# Patient Record
Sex: Male | Born: 1960 | Race: White | Hispanic: No | Marital: Married | Smoking: Former smoker
Health system: Southern US, Community
[De-identification: ages and names within clinical notes are randomized; demographics above are authoritative.]

## PROBLEM LIST (undated history)

## (undated) DIAGNOSIS — E785 Hyperlipidemia, unspecified: Secondary | ICD-10-CM

## (undated) DIAGNOSIS — M109 Gout, unspecified: Secondary | ICD-10-CM

## (undated) DIAGNOSIS — G4730 Sleep apnea, unspecified: Secondary | ICD-10-CM

## (undated) DIAGNOSIS — I890 Lymphedema, not elsewhere classified: Secondary | ICD-10-CM

## (undated) DIAGNOSIS — K819 Cholecystitis, unspecified: Secondary | ICD-10-CM

## (undated) DIAGNOSIS — M48062 Spinal stenosis, lumbar region with neurogenic claudication: Secondary | ICD-10-CM

## (undated) DIAGNOSIS — M65352 Trigger finger, left little finger: Secondary | ICD-10-CM

## (undated) DIAGNOSIS — I872 Venous insufficiency (chronic) (peripheral): Secondary | ICD-10-CM

## (undated) DIAGNOSIS — M1990 Unspecified osteoarthritis, unspecified site: Secondary | ICD-10-CM

## (undated) DIAGNOSIS — I6523 Occlusion and stenosis of bilateral carotid arteries: Secondary | ICD-10-CM

## (undated) DIAGNOSIS — M0600 Rheumatoid arthritis without rheumatoid factor, unspecified site: Secondary | ICD-10-CM

## (undated) DIAGNOSIS — H903 Sensorineural hearing loss, bilateral: Secondary | ICD-10-CM

## (undated) DIAGNOSIS — M5416 Radiculopathy, lumbar region: Secondary | ICD-10-CM

## (undated) DIAGNOSIS — E119 Type 2 diabetes mellitus without complications: Secondary | ICD-10-CM

## (undated) DIAGNOSIS — M51369 Other intervertebral disc degeneration, lumbar region without mention of lumbar back pain or lower extremity pain: Secondary | ICD-10-CM

## (undated) DIAGNOSIS — I10 Essential (primary) hypertension: Secondary | ICD-10-CM

## (undated) DIAGNOSIS — G5602 Carpal tunnel syndrome, left upper limb: Secondary | ICD-10-CM

## (undated) DIAGNOSIS — M5412 Radiculopathy, cervical region: Secondary | ICD-10-CM

## (undated) DIAGNOSIS — N401 Enlarged prostate with lower urinary tract symptoms: Secondary | ICD-10-CM

## (undated) DIAGNOSIS — I700 Atherosclerosis of aorta: Secondary | ICD-10-CM

## (undated) DIAGNOSIS — I2510 Atherosclerotic heart disease of native coronary artery without angina pectoris: Secondary | ICD-10-CM

## (undated) DIAGNOSIS — M5030 Other cervical disc degeneration, unspecified cervical region: Secondary | ICD-10-CM

## (undated) DIAGNOSIS — K760 Fatty (change of) liver, not elsewhere classified: Secondary | ICD-10-CM

## (undated) DIAGNOSIS — K219 Gastro-esophageal reflux disease without esophagitis: Secondary | ICD-10-CM

## (undated) DIAGNOSIS — G5601 Carpal tunnel syndrome, right upper limb: Secondary | ICD-10-CM

## (undated) DIAGNOSIS — D649 Anemia, unspecified: Secondary | ICD-10-CM

## (undated) HISTORY — DX: Type 2 diabetes mellitus without complications: E11.9

## (undated) HISTORY — DX: Unspecified osteoarthritis, unspecified site: M19.90

## (undated) HISTORY — PX: HERNIA REPAIR: SHX51

## (undated) HISTORY — DX: Essential (primary) hypertension: I10

## (undated) HISTORY — PX: NEUROPLASTY / TRANSPOSITION MEDIAN NERVE AT CARPAL TUNNEL: SUR893

## (undated) HISTORY — PX: CHOLECYSTECTOMY: SHX55

## (undated) HISTORY — PX: COLONOSCOPY: SHX174

## (undated) HISTORY — PX: NECK SURGERY: SHX720

---

## 1998-01-16 HISTORY — PX: SPINE SURGERY: SHX786

## 2003-01-17 HISTORY — PX: UMBILICAL HERNIA REPAIR: SHX196

## 2004-11-06 ENCOUNTER — Ambulatory Visit: Payer: Self-pay | Admitting: Internal Medicine

## 2004-11-18 ENCOUNTER — Ambulatory Visit: Payer: Self-pay | Admitting: Internal Medicine

## 2004-12-19 ENCOUNTER — Ambulatory Visit: Payer: Self-pay | Admitting: Internal Medicine

## 2007-11-03 ENCOUNTER — Ambulatory Visit: Payer: Self-pay | Admitting: Podiatry

## 2007-12-25 ENCOUNTER — Other Ambulatory Visit: Payer: Self-pay

## 2007-12-25 ENCOUNTER — Ambulatory Visit: Payer: Self-pay | Admitting: Podiatry

## 2008-01-01 ENCOUNTER — Ambulatory Visit: Payer: Self-pay | Admitting: Podiatry

## 2009-08-15 ENCOUNTER — Ambulatory Visit: Payer: Self-pay | Admitting: Gastroenterology

## 2011-02-20 ENCOUNTER — Ambulatory Visit: Payer: Self-pay | Admitting: Cardiology

## 2011-05-20 ENCOUNTER — Ambulatory Visit: Payer: Self-pay | Admitting: Unknown Physician Specialty

## 2013-01-18 ENCOUNTER — Ambulatory Visit: Payer: Self-pay | Admitting: Unknown Physician Specialty

## 2013-03-11 ENCOUNTER — Ambulatory Visit: Payer: Self-pay | Admitting: Physical Medicine and Rehabilitation

## 2014-05-30 DIAGNOSIS — G4733 Obstructive sleep apnea (adult) (pediatric): Secondary | ICD-10-CM | POA: Insufficient documentation

## 2014-06-16 ENCOUNTER — Ambulatory Visit: Payer: Self-pay | Admitting: Physical Medicine and Rehabilitation

## 2014-08-18 HISTORY — PX: SPINE SURGERY: SHX786

## 2014-12-15 ENCOUNTER — Encounter: Payer: Self-pay | Admitting: Rheumatology

## 2014-12-19 ENCOUNTER — Encounter: Payer: Self-pay | Admitting: Rheumatology

## 2015-01-17 ENCOUNTER — Encounter
Admit: 2015-01-17 | Disposition: A | Discharge status: Home / Self Care | Payer: Self-pay | Attending: Rheumatology | Admitting: Rheumatology

## 2015-02-18 ENCOUNTER — Emergency Department: Admit: 2015-02-18 | Disposition: A | Discharge status: Home / Self Care | Payer: Self-pay | Admitting: Internal Medicine

## 2015-02-18 LAB — CBC
HCT: 44.9 % (ref 40.0–52.0)
HGB: 15.4 g/dL (ref 13.0–18.0)
MCH: 29.3 pg (ref 26.0–34.0)
MCHC: 34.2 g/dL (ref 32.0–36.0)
MCV: 86 fL (ref 80–100)
PLATELETS: 231 10*3/uL (ref 150–440)
RBC: 5.25 10*6/uL (ref 4.40–5.90)
RDW: 14.5 % (ref 11.5–14.5)
WBC: 10.5 10*3/uL (ref 3.8–10.6)

## 2015-02-18 LAB — COMPREHENSIVE METABOLIC PANEL
ALK PHOS: 82 U/L
ALT: 26 U/L
Albumin: 4.5 g/dL
Anion Gap: 8 (ref 7–16)
BILIRUBIN TOTAL: 1.4 mg/dL — AB
BUN: 7 mg/dL
CREATININE: 0.82 mg/dL
Calcium, Total: 9.2 mg/dL
Chloride: 104 mmol/L
Co2: 25 mmol/L
EGFR (African American): >60
Glucose: 155 mg/dL — ABNORMAL HIGH
Potassium: 2.8 mmol/L — ABNORMAL LOW
SGOT(AST): 26 U/L
Sodium: 137 mmol/L
Total Protein: 7.8 g/dL

## 2015-02-18 LAB — PROTIME-INR
INR: 1
PROTHROMBIN TIME: 13.3 s

## 2015-02-18 LAB — URINALYSIS, COMPLETE
Bacteria: NONE SEEN
Bilirubin,UR: NEGATIVE
Blood: NEGATIVE
Glucose,UR: NEGATIVE mg/dL (ref 0–75)
KETONE: NEGATIVE
Leukocyte Esterase: NEGATIVE
NITRITE: NEGATIVE
PH: 7 (ref 4.5–8.0)
Protein: NEGATIVE
RBC, UR: NONE SEEN /HPF (ref 0–5)
Specific Gravity: 1.008 (ref 1.003–1.030)
Squamous Epithelial: NONE SEEN

## 2016-07-19 HISTORY — PX: CARPAL TUNNEL RELEASE: SHX101

## 2016-07-30 DIAGNOSIS — Z794 Long term (current) use of insulin: Secondary | ICD-10-CM | POA: Insufficient documentation

## 2017-01-30 DIAGNOSIS — I10 Essential (primary) hypertension: Secondary | ICD-10-CM | POA: Insufficient documentation

## 2018-05-29 ENCOUNTER — Ambulatory Visit
Admission: RE | Admit: 2018-05-29 | Discharge: 2018-05-29 | Disposition: A | Discharge status: Home / Self Care | Payer: BLUE CROSS/BLUE SHIELD | Source: Ambulatory Visit | Attending: Family Medicine | Admitting: Family Medicine

## 2018-05-29 ENCOUNTER — Other Ambulatory Visit: Payer: Self-pay | Admitting: Family Medicine

## 2018-05-29 DIAGNOSIS — R0602 Shortness of breath: Secondary | ICD-10-CM | POA: Diagnosis not present

## 2018-05-29 DIAGNOSIS — J9811 Atelectasis: Secondary | ICD-10-CM | POA: Diagnosis not present

## 2018-05-29 DIAGNOSIS — I517 Cardiomegaly: Secondary | ICD-10-CM | POA: Diagnosis not present

## 2020-04-05 ENCOUNTER — Other Ambulatory Visit: Payer: Self-pay | Admitting: Rheumatology

## 2020-04-05 DIAGNOSIS — M542 Cervicalgia: Secondary | ICD-10-CM

## 2020-04-05 DIAGNOSIS — M4802 Spinal stenosis, cervical region: Secondary | ICD-10-CM

## 2020-04-05 DIAGNOSIS — M159 Polyosteoarthritis, unspecified: Secondary | ICD-10-CM

## 2020-04-20 ENCOUNTER — Other Ambulatory Visit: Payer: Self-pay

## 2020-04-20 ENCOUNTER — Ambulatory Visit
Admission: RE | Admit: 2020-04-20 | Discharge: 2020-04-20 | Disposition: A | Discharge status: Home / Self Care | Payer: BC Managed Care – PPO | Source: Ambulatory Visit | Attending: Rheumatology | Admitting: Rheumatology

## 2020-04-20 DIAGNOSIS — M542 Cervicalgia: Secondary | ICD-10-CM | POA: Insufficient documentation

## 2020-04-20 DIAGNOSIS — M8949 Other hypertrophic osteoarthropathy, multiple sites: Secondary | ICD-10-CM | POA: Diagnosis present

## 2020-04-20 DIAGNOSIS — M159 Polyosteoarthritis, unspecified: Secondary | ICD-10-CM

## 2020-04-20 DIAGNOSIS — M4802 Spinal stenosis, cervical region: Secondary | ICD-10-CM | POA: Insufficient documentation

## 2020-10-04 DIAGNOSIS — I2510 Atherosclerotic heart disease of native coronary artery without angina pectoris: Secondary | ICD-10-CM | POA: Insufficient documentation

## 2020-12-25 NOTE — Progress Notes (Signed)
[facility]  [address], [address] [place], [place] [address] Phone: [phone]  Fax: [phone]   Clinic Day:  12/26/2020  Referring physician: Sharyne Peach, MD  Chief Complaint: Timothy Kennedy is a 60 y.o. male with seronegative rheumatoid arthritis/inflammatory arthritis and pancytopenia who is referred in consultation by Dr. Iona Beard for assessment and management.   HPI: The patient has a history of arthritis which was empirically treated as rheumatoid arthritis.  Rheumatoid factor and CCP were negative.  He was on a regimen of methotrexate, folic acid and Plaquenil.  Medications were discontinued in the summer 2021(?) due to lack of benefit.  He has not seen Dr. Jefm Bryant since that time.  Since subsequently he noticed increased stiffness in his hands and worsening pain in his shoulders  The patient has a long history of alcohol use. He typically drank 6-12 beers a day for many years and more recently liquor.  He stopped drinking alcohol completely on 09/18/2020.  The patient was admitted to [facility] on 11/22/2020 - 11/26/2020.  He presented with worsening fatigue, dizziness, headache, visual changes, dysphagia and a 40-50 pound weight loss in 3- 6 months (partially intentional).  He stopped drinking alcohol on 09/18/2020 and was eating smaller portions.  However weight loss was felt out of proportion to dietary changes.  Blood pressure medications were adjusted.  Upper endoscopy revealed esophagitis and small abnormal mucosa in the duodenum.  Pathology revealed gastric heterotopia.  He was started on a PPI.  He was noted to be folate deficient, likely a consequence of previous methotrexate use and discontinuation of oral folate.  He was started on supplementation.  He had a hypoproliferative marrow.  He was felt to possibly need additional evaluation if his myelosuppression and cytopenias did not improve with supplementation.  Peripheral smear revealed no large  granular lymphocytes.  Hematology was consulted.  He had a low iron and low iron saturation c/w iron deficiency.  Ferritin was elevated at 371.  Folate was 4.9 (low) and B12 was 250 (borderline).  Homocysteine 19  (elevated) MMA was pending sed rate was trending up (17 to 27).  CRP was trending up (2.6 to 3.34).  LDH was 302 and haptoglobin 52 and thus not consistent with hemolysis.  Peripheral smear revealed no evidence of large leukocytes.  He was to continue thiamine, folic acid, L24 and a multivitamin.  He was felt to need nutritional replacement and then reassessment in 1 month  Rheumatology was consulted.  It was unclear if he had rheumatoid arthritis.  He had no evidence of active synovitis tested negative for rheumatoid factor and anti-CCP antibodies.  Felty syndrome was not felt to occur in patients with seronegative rheumatoid arthritis possibility of large granular lymphocyte syndrome associated with inflammatory polyarthritis was found felt to be a possible explanation.  And no evidence of large granular lymphocytes on review of the peripheral smear.  T-cell clonality studies were considered.  Chest, abdomen, and pelvis CT on 11/23/2020 revealed no etiology for patient's unintentional weight loss. There was splenomegaly (no measurement).  Colonoscopy on 09/12/2020 revealed one tubular adenoma and one tubulovillous adenoma.    The patient saw Dr. Iona Beard on 12/15/2020. He was referred to hematology.  He uses a CPAP for sleep apnea.  Labs followed: 02/18/2015:  Hematocrit 44.9, hemoglobin 15.4, MCV 86.0, platelets 231,000, WBC 10,500. 09/19/2016:  Hematocrit 38.5, hemoglobin 13.5, MCV 93.4, platelets 205,000, WBC   5,300. 09/19/2017:  Hematocrit 38.1, hemoglobin 12.8, MCV 95.7, platelets 195,000, WBC  6,400. 03/26/2018:  Hematocrit 37.7, hemoglobin 13.2, MCV 96.2, platelets 191,000, WBC   6,900. 06/24/2018:  Hematocrit 39.9, hemoglobin 13.6, MCV 96.4, platelets 205,000, WBC    5,300. 09/24/2018:  Hematocrit 38.7, hemoglobin 13.0, MCV 97.2, platelets 191,000, WBC   5,400. 03/29/2019:  Hematocrit 42.0, hemoglobin 14.6, MCV 98.1, platelets 231,000, WBC   7,200. 07/01/2019:  Hematocrit 39.6, hemoglobin 13.4, MCV 99.2, platelets 208,000, WBC   5,500. 09/23/2019:  Hematocrit 39.9, hemoglobin 13.4, MCV 97.1, platelets 188,000, WBC   5,300. 11/22/2020:  Hematocrit 30.2, hemoglobin 10.0, MCV 89.0, platelets 162,000, WBC   2,000. 11/26/2020:  Hematocrit 26.4, hemoglobin   8.6, MCV 89.0, platelets 160,000, WBC   1,900. 12/11/2020:  Hematocrit 32.9, hemoglobin 10.8, MCV 90.0, platelets 189,000, WBC   3,600.  Additional labs: 11/22/2020: Ferritin was 371 with an iron saturation of 16% and a TIBC 264. Vitamin B12 was 250 (low normal) and folate 4.9 (>6.5).  Homocysteine was 19 (< 13).  MMA was 0.26 (normal).  Coombs negative. Retic 3.81% on 11/24/2020.  HIV testing negative.  PT 13.0 and INR 1.1 on 11/24/2020.  PTT was 50.7 (elevated) with a fibrinogen of 337 and a D-dimer of 2477 (< 500).  COVID-19 testing was negative.  TSH was 1.47 with a free T4 1.03.  LFTs were normal on 11/23/2020 except for an albumin of 2.8.  Total protein was 6.3 (normal).  Sed rate was 27 (< 20) and CRP 3.34 (< 0.6) on 11/23/2020.  LDH was 302 (100-200) on 11/23/2020.  Symptomatically, he denies any fevers or sweats.  He states that he is working on weight loss (350 pounds to 310 pounds) secondary to back issues.  He notes a cough and some sinus issues.  He denies any abdominal symptoms.  He denies any melena or hematochezia.  He is on Protonix.  He notes pain in his neck, left shoulder, hands, left knee, lower back and feet.  He has some numbness in his toes.    Regarding his diet, he is no longer snacking/grazing.  He is eating 2 meals a day.  He is not eating very much green leafy vegetables.  He is taking H63 and folic acid.  He is not drinking alcohol.  He denies any new medications or herbal  products.   Past Medical History:  Diagnosis Date  . Arthritis   . Diabetes mellitus without complication ([place])   . Hypertension     Past Surgical History:  Procedure Laterality Date  . CARPAL TUNNEL RELEASE Bilateral 07/2016   surgey on both hands  . NECK SURGERY  01/1998 08/2019  . SPINE SURGERY  01/1998   fusion of C5 and C6  . SPINE SURGERY  08/2014   fusion   . UMBILICAL HERNIA REPAIR  01/2003    Family History  Problem Relation Age of Onset  . Hypertension Mother   . Diabetes Mother   . Hypertension Father     Social History:  reports that he has never smoked. He has quit using smokeless tobacco.  His smokeless tobacco use included chew. He reports previous alcohol use. He reports that he does not use drugs.  He chewed tobacco when he was young (none in the past 25 years).  He is exposed to dust and asbestos.  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  He lives in [place].  The patient is alone today.  Allergies:  Allergies  Allergen Reactions  . Atorvastatin  Other (See Comments)  . Metformin Diarrhea    Current Medications: Current Outpatient Medications  Medication Sig Dispense Refill  . aspirin 81 MG EC tablet Take by mouth daily.    . Blood Glucose Monitoring Suppl (FIFTY50 GLUCOSE METER 2.0) w/Device KIT Use as directed E11.9    . carvedilol (COREG) 25 MG tablet Take by mouth 2 (two) times daily.    . cyanocobalamin 1000 MCG tablet Take 1,000 mcg by mouth daily.    . DULoxetine (CYMBALTA) 30 MG capsule Take by mouth daily.    . fluticasone (FLONASE) 50 MCG/ACT nasal spray Place into the nose as needed.    . folic acid (FOLVITE) 1 MG tablet Take by mouth daily.    Marland Kitchen glipiZIDE (GLUCOTROL XL) 10 MG 24 hr tablet Take by mouth 2 (two) times daily.    Marland Kitchen lisinopril (ZESTRIL) 20 MG tablet Take by mouth daily.    . Multiple Vitamin (QUINTABS) TABS Take 1 tablet by mouth daily.    Glory Rosebush VERIO test strip daily.    . pantoprazole (PROTONIX) 40 MG tablet Take by mouth daily.    . pravastatin (PRAVACHOL) 20 MG tablet Take by mouth daily.    . SUMAtriptan (IMITREX) 25 MG tablet Take by mouth as needed.    . thiamine 100 MG tablet Take by mouth daily.     No current facility-administered medications for this visit.    Review of Systems  Constitutional: Negative for chills, diaphoresis, fever, malaise/fatigue and weight loss (intentional).  HENT: Negative for congestion, ear discharge, ear pain, hearing loss, nosebleeds, sinus pain, sore throat and tinnitus.   Eyes: Negative for blurred vision.       Floaters.  Respiratory: Negative for cough (secondary to sinus issues), hemoptysis, sputum production and shortness of breath.        Sleep apnea.  Cardiovascular: Negative for chest pain, palpitations and leg swelling.  Gastrointestinal: Negative for abdominal pain, blood in stool, constipation, diarrhea, heartburn, melena, nausea and vomiting.  Genitourinary: Negative for dysuria, frequency, hematuria and urgency.  Musculoskeletal: Positive for joint pain (neck, hands, left knee, left shoulder, lower back, feet). Negative for back pain, myalgias and neck pain.       Spinal stenosis.  Skin: Negative for itching and rash.  Neurological: Negative for dizziness, tingling, sensory change (numbness in toes), weakness and headaches (Intermittent migraines).  Endo/Heme/Allergies: Does not bruise/bleed easily.  Psychiatric/Behavioral: Negative for depression and memory loss. The patient is not nervous/anxious and does not have insomnia.   All other systems reviewed and are negative.  Performance status (ECOG): 1  Vitals Blood pressure (!) 156/88, pulse 70, temperature (!) 97.1 F (36.2 C), temperature source Tympanic, resp. rate 16, weight (!) 309 lb 10.2 oz (140.5 kg), SpO2 97 %.   Physical Exam Vitals and nursing note reviewed.  Constitutional:      General: He is not  in acute distress.    Appearance: He is not diaphoretic.     Comments: Heavyset gentleman sitting comfortably in the exam room in no acute distress.  HENT:     Head: Normocephalic and atraumatic.     Comments: Male pattern baldness.  Gray beard and ponytail.    Mouth/Throat:     Mouth: Mucous membranes are moist.     Pharynx: Oropharynx is clear.  Eyes:     General: No scleral icterus.    Extraocular Movements: Extraocular movements intact.     Conjunctiva/sclera: Conjunctivae normal.     Pupils: Pupils are equal,  round, and reactive to light.     Comments: Brown eyes.  Cardiovascular:     Rate and Rhythm: Normal rate and regular rhythm.     Heart sounds: Normal heart sounds. No murmur heard.   Pulmonary:     Effort: Pulmonary effort is normal. No respiratory distress.     Breath sounds: Normal breath sounds. No wheezing or rales.  Chest:     Chest wall: No tenderness.  Breasts:     Right: No axillary adenopathy or supraclavicular adenopathy.     Left: No axillary adenopathy or supraclavicular adenopathy.    Abdominal:     General: Bowel sounds are normal. There is no distension.     Palpations: Abdomen is soft. There is no mass.     Tenderness: There is no abdominal tenderness. There is no guarding or rebound.     Comments: Questionable spleen tip.  Musculoskeletal:        General: No swelling or tenderness. Normal range of motion.     Cervical back: Normal range of motion and neck supple.  Lymphadenopathy:     Head:     Right side of head: No preauricular, posterior auricular or occipital adenopathy.     Left side of head: No preauricular, posterior auricular or occipital adenopathy.     Cervical: No cervical adenopathy.     Upper Body:     Right upper body: No supraclavicular or axillary adenopathy.     Left upper body: No supraclavicular or axillary adenopathy.     Lower Body: No right inguinal adenopathy. No left inguinal adenopathy.  Skin:    General: Skin is  warm and dry.  Neurological:     Mental Status: He is alert and oriented to person, place, and time.  Psychiatric:        Behavior: Behavior normal.        Thought Content: Thought content normal.        Judgment: Judgment normal.    No visits with results within 3 Day(s) from this visit.  Latest known visit with results is:  No results found for any previous visit.    Assessment:  Timothy Kennedy is a 60 y.o. male with seronegative rheumatoid arthritis/inflammatory arthritis and pancytopenia.   Work-up during [facility] admission from 11/22/2020 - 11/26/2020 revealed a ferritin 371 with an iron saturation of 16% and a TIBC 264. Sed rate was 27 (< 20) and CRP 3.34 (< 0.6)   Retic was 3.81%.  Vitamin B12 was 250 (low normal) and folate 4.9 (>6.5).  Albumen was 2.8 (low) and total protein 6.3 (normal).  LDH was 302 (100-200).  Normal studies included:  Coombs, HIV testing, COVID-19 testing, TSH/free T4, PT/INR, fibrinogen.  PTT was 50.7 (elevated) and D-dimer of 2477 (< 500).   CBC on 11/26/2020 revealed a hematocrit 26.4, hemoglobin   8.6, MCV 89.0, platelets 160,000, WBC 1,900.  CBC on 12/11/2020 revealed a hematocrit 32.9, hemoglobin 10.8, MCV 90.0, platelets 189,000, WBC 3,600.  He has B12 deficiency and folate deficiency.  B12 was 250 and folate 4.9 on 11/22/2020.  He is on B12 2000 mcg a day and folate 1 mg a day  Chest, abdomen, and pelvis CT on 11/23/2020 at [facility] revealed no etiology for patient's unintentional weight loss. There was splenomegaly (no measurement).  Colonoscopy on 09/12/2020 revealed one tubular adenoma and one tubulovillous adenoma.  Upper endoscopy on 11/24/2020 revealed esophagitis and some abnormal mucosa in the duodenum. Pathology showed gastric heterotopia.   The patient  has a long history of alcohol use. He typically drank 6-12 beers a day for many years and more recently liquor.  He stopped drinking alcohol on 09/18/2020.  Symptomatically, he denies any fevers or  sweats.  He has had intentional weight loss.  He has joint pain.  Exam reveals questionable spleen tip.  Plan: 1.   Labs today:  CBC with diff, CMP, myeloma panel, LDH, ferritin, iron studies, B12, folate, sed rate, CRP, SPEP, PTT   2.   Peripheral smear for path review. 3.   Pancytopenia  Etiology is unclear.  Discuss differential diagnosis of pancytopenia and imaging at [facility] notable for splenomegaly.   Contact [facility] regarding measurements of spleen.  Differential diagnosis of splenomegaly:  liver disease/cirrhosis, malignancy (leukemia, lymphoma, MPD), infection (hepatitis, HIV, EBV, CMV), and autoimmune (SLE, RA).   Pancytopenia may have been secondary to B12 and folate deficiency.   He is on B12 and folate supplementation and is no longer drinking alcohol.  Discuss plans for likely bone marrow aspirate and biopsy if work-up negative and pancytopenia persist. 4.   Elevated PTT  PTT was 50.7 (elevated) during his [facility] admission.  Recheck PTT.    If PTT elevated will perform PTT with mix to determine factor deficiency or inhibitor. 5.   RN: Please call radiology at [facility] and obtain measurements of spleen. 6.   RTC in 1 week for MD assess, review of work-up and discussion regarding direction of therapy.  Addendum: Spleen size on the [facility] abdomen CT on 11/23/2020 was 18.9 x 7.5 x 14.6 cm.  I discussed the assessment and treatment plan with the patient.  The patient was provided an opportunity to ask questions and all were answered.  The patient agreed with the plan and demonstrated an understanding of the instructions.  The patient was advised to call back if the symptoms worsen or if the condition fails to improve as anticipated.   Xander Jutras C. Mike Gip, MD, PhD    12/26/2020, 2:38 PM

## 2020-12-26 ENCOUNTER — Inpatient Hospital Stay: Payer: BC Managed Care – PPO | Attending: Hematology and Oncology | Admitting: Hematology and Oncology

## 2020-12-26 ENCOUNTER — Other Ambulatory Visit: Payer: Self-pay

## 2020-12-26 ENCOUNTER — Encounter: Payer: Self-pay | Admitting: Hematology and Oncology

## 2020-12-26 ENCOUNTER — Inpatient Hospital Stay: Payer: BC Managed Care – PPO

## 2020-12-26 VITALS — BP 156/88 | HR 70 | Temp 97.1°F | Resp 16 | Wt 309.6 lb

## 2020-12-26 DIAGNOSIS — K760 Fatty (change of) liver, not elsewhere classified: Secondary | ICD-10-CM

## 2020-12-26 DIAGNOSIS — E538 Deficiency of other specified B group vitamins: Secondary | ICD-10-CM

## 2020-12-26 DIAGNOSIS — R791 Abnormal coagulation profile: Secondary | ICD-10-CM | POA: Diagnosis present

## 2020-12-26 DIAGNOSIS — R161 Splenomegaly, not elsewhere classified: Secondary | ICD-10-CM

## 2020-12-26 DIAGNOSIS — D61818 Other pancytopenia: Secondary | ICD-10-CM | POA: Insufficient documentation

## 2020-12-26 LAB — COMPREHENSIVE METABOLIC PANEL
ALT: 16 U/L (ref 0–44)
AST: 24 U/L (ref 15–41)
Albumin: 3.8 g/dL (ref 3.5–5.0)
Alkaline Phosphatase: 60 U/L (ref 38–126)
Anion gap: 12 (ref 5–15)
BUN: 12 mg/dL (ref 8–23)
CO2: 26 mmol/L (ref 22–32)
Calcium: 9.2 mg/dL (ref 8.9–10.3)
Chloride: 101 mmol/L (ref 98–111)
Creatinine, Ser: 0.94 mg/dL (ref 0.61–1.24)
GFR, Estimated: >60 mL/min (ref >60)
Glucose, Bld: 157 mg/dL — ABNORMAL HIGH (ref 70–99)
Potassium: 3.8 mmol/L (ref 3.5–5.1)
Sodium: 139 mmol/L (ref 135–145)
Total Bilirubin: 1 mg/dL (ref 0.3–1.2)
Total Protein: 8 g/dL (ref 6.5–8.1)

## 2020-12-26 LAB — CBC WITH DIFFERENTIAL/PLATELET
Abs Immature Granulocytes: 0.02 10*3/uL (ref 0.00–0.07)
Basophils Absolute: 0 10*3/uL (ref 0.0–0.1)
Basophils Relative: 1 %
Eosinophils Absolute: 0.2 10*3/uL (ref 0.0–0.5)
Eosinophils Relative: 3 %
HCT: 39.1 % (ref 39.0–52.0)
Hemoglobin: 13.3 g/dL (ref 13.0–17.0)
Immature Granulocytes: 0 %
Lymphocytes Relative: 10 %
Lymphs Abs: 0.6 10*3/uL — ABNORMAL LOW (ref 0.7–4.0)
MCH: 29.4 pg (ref 26.0–34.0)
MCHC: 34 g/dL (ref 30.0–36.0)
MCV: 86.3 fL (ref 80.0–100.0)
Monocytes Absolute: 0.4 10*3/uL (ref 0.1–1.0)
Monocytes Relative: 7 %
Neutro Abs: 4.3 10*3/uL (ref 1.7–7.7)
Neutrophils Relative %: 79 %
Platelets: 232 10*3/uL (ref 150–400)
RBC: 4.53 MIL/uL (ref 4.22–5.81)
RDW: 13.7 % (ref 11.5–15.5)
WBC: 5.5 10*3/uL (ref 4.0–10.5)
nRBC: 0 % (ref 0.0–0.2)

## 2020-12-26 LAB — LACTATE DEHYDROGENASE: LDH: 132 U/L (ref 98–192)

## 2020-12-26 LAB — SEDIMENTATION RATE: Sed Rate: 57 mm/hr — ABNORMAL HIGH (ref 0–20)

## 2020-12-26 LAB — APTT: aPTT: 51 seconds — ABNORMAL HIGH (ref 24–36)

## 2020-12-27 LAB — PATHOLOGIST SMEAR REVIEW

## 2020-12-27 LAB — IRON AND TIBC
Iron: 60 ug/dL (ref 45–182)
Saturation Ratios: 19 % (ref 17.9–39.5)
TIBC: 309 ug/dL (ref 250–450)
UIBC: 249 ug/dL

## 2020-12-27 LAB — C-REACTIVE PROTEIN: CRP: 0.7 mg/dL (ref <1.0)

## 2020-12-27 LAB — VITAMIN B12: Vitamin B-12: 572 pg/mL (ref 180–914)

## 2020-12-27 LAB — FOLATE: Folate: 31 ng/mL (ref >5.9)

## 2020-12-27 LAB — FERRITIN: Ferritin: 91 ng/mL (ref 24–336)

## 2020-12-28 LAB — MULTIPLE MYELOMA PANEL, SERUM
Albumin SerPl Elph-Mcnc: 3.6 g/dL (ref 2.9–4.4)
Albumin/Glob SerPl: 1 (ref 0.7–1.7)
Alpha 1: 0.3 g/dL (ref 0.0–0.4)
Alpha2 Glob SerPl Elph-Mcnc: 0.8 g/dL (ref 0.4–1.0)
B-Globulin SerPl Elph-Mcnc: 1.2 g/dL (ref 0.7–1.3)
Gamma Glob SerPl Elph-Mcnc: 1.5 g/dL (ref 0.4–1.8)
Globulin, Total: 3.7 g/dL (ref 2.2–3.9)
IgA: 429 mg/dL (ref 61–437)
IgG (Immunoglobin G), Serum: 1494 mg/dL (ref 603–1613)
IgM (Immunoglobulin M), Srm: 225 mg/dL — ABNORMAL HIGH (ref 20–172)
Total Protein ELP: 7.3 g/dL (ref 6.0–8.5)

## 2021-01-01 NOTE — Progress Notes (Signed)
[facility]  [address] [place], [place] [address] Phone: [phone]  Fax: [phone]   Clinic Day:  01/02/2021  Referring physician: Sharyne Peach, MD  Chief Complaint: Timothy Kennedy is a 60 y.o. male with seronegative rheumatoid arthritis/inflammatory arthritis and pancytopenia who is seen for review of work-up and discussion regarding direction of therapy.  HPI: The patient was last seen in the hematology clinic on 12/26/2020 for new patient assessment. At that time, he was noted to have pancytopenia.  He was B12 and folate deficient on supplementation.  He is stopped drinking alcohol on 09/18/2020.  Symptomatically, he denies any fevers or sweats.  He had intentionally lost weight.  He had joint pain.  CT scans from [place] on 11/23/2020 had revealed splenomegaly (18.9 x 7.5 x 14.6 cm).  Work-up revealed a hematocrit of 39.1, hemoglobin 13.3, platelets 232,000, and WBC 5,500 with an ANC of 4300. CMP was normal. Ferritin was 91 with an iron saturation of 19% and a TIBC of 309.  Sed rate was 57 and CRP 0.7.   B12 was 572 and folate 31.0. LDH was 132. PTT was 51. SPEP revealed a polyclonal increase in 1 or more immunoglobulins.  IgM was 225 (20-172).    Peripheral smear revealed normal WBC count and differential. Erythrocytes were normocytic, with unremarkable RBC parameters. Platelet count and morphology were normal.   Symptomatically, he has been "alright." He is tired because he did not sleep well last night. He has neck, back, and shoulder pain. He has occasional diarrhea. He has had one "uncontrollable" episode of diarrhea in the past 2 weeks. He denies any excess bruising or bleeding.  He has not drank any alcohol recently.  His wife states that she has noticed a difference in the patient. He has low energy and "nothing excites him."    Past Medical History:  Diagnosis Date  . Arthritis   . Diabetes mellitus without complication ([place])   .  Hypertension     Past Surgical History:  Procedure Laterality Date  . CARPAL TUNNEL RELEASE Bilateral 07/2016   surgey on both hands  . NECK SURGERY  01/1998 08/2019  . SPINE SURGERY  01/1998   fusion of C5 and C6  . SPINE SURGERY  08/2014   fusion   . UMBILICAL HERNIA REPAIR  01/2003    Family History  Problem Relation Age of Onset  . Hypertension Mother   . Diabetes Mother   . Hypertension Father     Social History:  reports that he has never smoked. He has quit using smokeless tobacco.  His smokeless tobacco use included chew. He reports previous alcohol use. He reports that he does not use drugs.The patient is accompanied by his wife, Timothy Kennedy, today.  Allergies:  Allergies  Allergen Reactions  . Atorvastatin Other (See Comments)  . Metformin Diarrhea    Current Medications: Current Outpatient Medications  Medication Sig Dispense Refill  . aspirin 81 MG EC tablet Take by mouth daily.    . Blood Glucose Monitoring Suppl (FIFTY50 GLUCOSE METER 2.0) w/Device KIT Use as directed E11.9    . carvedilol (COREG) 25 MG tablet Take by mouth 2 (two) times daily.    . cyanocobalamin 1000 MCG tablet Take 1,000 mcg by mouth daily.    . DULoxetine (CYMBALTA) 30 MG capsule Take by mouth daily.    . fluticasone (FLONASE) 50 MCG/ACT nasal spray Place into the nose as needed.    . folic acid (FOLVITE) 1  MG tablet Take by mouth daily.    Marland Kitchen glipiZIDE (GLUCOTROL XL) 10 MG 24 hr tablet Take by mouth 2 (two) times daily.    Marland Kitchen lisinopril (ZESTRIL) 20 MG tablet Take by mouth in the morning and at bedtime.    . Multiple Vitamin (QUINTABS) TABS Take 1 tablet by mouth daily.    Glory Rosebush VERIO test strip daily.    . pantoprazole (PROTONIX) 40 MG tablet Take by mouth daily.    . pravastatin (PRAVACHOL) 20 MG tablet Take by mouth daily.    . SUMAtriptan (IMITREX) 25 MG tablet Take by mouth as needed.    . thiamine 100 MG tablet Take by mouth daily.     No current facility-administered medications  for this visit.    Review of Systems  Constitutional: Negative for chills, diaphoresis, fever, malaise/fatigue and weight loss (up 2 lbs).       Feels "alright." Tired today.  HENT: Negative for congestion, ear discharge, ear pain, hearing loss, nosebleeds, sinus pain, sore throat and tinnitus.   Eyes: Negative for blurred vision.  Respiratory: Negative for cough, hemoptysis, sputum production and shortness of breath.   Cardiovascular: Negative for chest pain, palpitations and leg swelling.  Gastrointestinal: Positive for diarrhea (on and off). Negative for abdominal pain, blood in stool, constipation, heartburn, melena, nausea and vomiting.  Genitourinary: Negative for dysuria, frequency, hematuria and urgency.  Musculoskeletal: Positive for back pain, joint pain (shoulder) and neck pain. Negative for myalgias.  Skin: Negative for itching and rash.  Neurological: Negative for dizziness, tingling, sensory change, weakness and headaches.  Endo/Heme/Allergies: Does not bruise/bleed easily.  Psychiatric/Behavioral: Negative for depression and memory loss. The patient is not nervous/anxious and does not have insomnia.   All other systems reviewed and are negative.  Performance status (ECOG): 1  Vitals Blood pressure (!) 172/77, pulse 75, temperature 97.9 F (36.6 C), temperature source Tympanic, resp. rate 18, weight (!) 311 lb 1.1 oz (141.1 kg), SpO2 99 %.   Physical Exam Vitals and nursing note reviewed.  Constitutional:      General: He is not in acute distress.    Appearance: He is not diaphoretic.  Eyes:     General: No scleral icterus.    Conjunctiva/sclera: Conjunctivae normal.  Neurological:     Mental Status: He is alert and oriented to person, place, and time.  Psychiatric:        Behavior: Behavior normal.        Thought Content: Thought content normal.        Judgment: Judgment normal.    No visits with results within 3 Day(s) from this visit.  Latest known visit  with results is:  Appointment on 12/26/2020  Component Date Value Ref Range Status  . CRP 12/26/2020 0.7  <1.0 mg/dL Final   Performed at [facility] [address] [place], [place] [address]  . Sed Rate 12/26/2020 57* 0 - 20 mm/hr Final   Performed at [facility], [address] [place],  [address]  . IgG (Immunoglobin G), Serum 12/26/2020 1,494  603 - 1,613 mg/dL Final  . IgA 12/26/2020 429  61 - 437 mg/dL Final  . IgM (Immunoglobulin M), Srm 12/26/2020 225* 20 - 172 mg/dL Final  . Total Protein ELP 12/26/2020 7.3  6.0 - 8.5 g/dL Corrected  . Albumin SerPl Elph-Mcnc 12/26/2020 3.6  2.9 - 4.4 g/dL Corrected  . Alpha 1 12/26/2020 0.3  0.0 - 0.4 g/dL Corrected  . Alpha2 Glob  SerPl Elph-Mcnc 12/26/2020 0.8  0.4 - 1.0 g/dL Corrected  . B-Globulin SerPl Elph-Mcnc 12/26/2020 1.2  0.7 - 1.3 g/dL Corrected  . Gamma Glob SerPl Elph-Mcnc 12/26/2020 1.5  0.4 - 1.8 g/dL Corrected  . M Protein SerPl Elph-Mcnc 12/26/2020 Not Observed  Not Observed g/dL Corrected  . Globulin, Total 12/26/2020 3.7  2.2 - 3.9 g/dL Corrected  . Albumin/Glob SerPl 12/26/2020 1.0  0.7 - 1.7 Corrected  . IFE 1 12/26/2020 Comment*  Corrected   Polyclonal increase detected in one or more immunoglobulins.  . Please Note 12/26/2020 Comment   Corrected   Comment: (NOTE) Protein electrophoresis scan will follow via computer, mail, or courier delivery. Performed At: [facility] [place], [place] [id number] Rush Farmer MD [id number]   . aPTT 12/26/2020 51* 24 - 36 seconds Final   Comment:        IF BASELINE aPTT IS ELEVATED, SUGGEST PATIENT RISK ASSESSMENT BE USED TO DETERMINE APPROPRIATE ANTICOAGULANT THERAPY. Performed at [facility], [address] [place], [place] [address]   . Folate 12/26/2020 31.0  >5.9 ng/mL Final   Performed at [facility], [place] [place], [place] [address]  . Vitamin B-12 12/26/2020 572  180 - 914 pg/mL  Final   Comment: (NOTE) This assay is not validated for testing neonatal or myeloproliferative syndrome specimens for Vitamin B12 levels. Performed at [facility], [place] [address] [place], [place] [address]   . Iron 12/26/2020 60  45 - 182 ug/dL Final  . TIBC 12/26/2020 309  250 - 450 ug/dL Final  . Saturation Ratios 12/26/2020 19  17.9 - 39.5 % Final  . UIBC 12/26/2020 249  ug/dL Final   Performed at [facility], [place] [address] [place], [place] [address]  . Ferritin 12/26/2020 91  24 - 336 ng/mL Final   Performed at [facility], [place] [place], [place] [address]  . LDH 12/26/2020 132  98 - 192 U/L Final   Performed at [facility], [address], [place], [place] [address]  . Sodium 12/26/2020 139  135 - 145 mmol/L Final  . Potassium 12/26/2020 3.8  3.5 - 5.1 mmol/L Final  . Chloride 12/26/2020 101  98 - 111 mmol/L Final  . CO2 12/26/2020 26  22 - 32 mmol/L Final  . Glucose, Bld 12/26/2020 157* 70 - 99 mg/dL Final   Glucose reference range applies only to samples taken after fasting for at least 8 hours.  . BUN 12/26/2020 12  8 - 23 mg/dL Final  . Creatinine, Ser 12/26/2020 0.94  0.61 - 1.24 mg/dL Final  . Calcium 12/26/2020 9.2  8.9 - 10.3 mg/dL Final  . Total Protein 12/26/2020 8.0  6.5 - 8.1 g/dL Final  . Albumin 12/26/2020 3.8  3.5 - 5.0 g/dL Final  . AST 12/26/2020 24  15 - 41 U/L Final  . ALT 12/26/2020 16  0 - 44 U/L Final  . Alkaline Phosphatase 12/26/2020 60  38 - 126 U/L Final  . Total Bilirubin 12/26/2020 1.0  0.3 - 1.2 mg/dL Final  . GFR, Estimated 12/26/2020 >60  >60 mL/min Final   Comment: (NOTE) Calculated using the CKD-EPI Creatinine Equation (2021)   . Anion gap 12/26/2020 12  5 - 15 Final   Performed at [facility], [address] [place], [place] [address]  . WBC 12/26/2020 5.5  4.0 - 10.5 K/uL Final  . RBC 12/26/2020 4.53  4.22 -  5.81 MIL/uL Final  . Hemoglobin 12/26/2020 13.3  13.0 - 17.0 g/dL  Final  . HCT 12/26/2020 39.1  39.0 - 52.0 % Final  . MCV 12/26/2020 86.3  80.0 - 100.0 fL Final  . MCH 12/26/2020 29.4  26.0 - 34.0 pg Final  . MCHC 12/26/2020 34.0  30.0 - 36.0 g/dL Final  . RDW 12/26/2020 13.7  11.5 - 15.5 % Final  . Platelets 12/26/2020 232  150 - 400 K/uL Final  . nRBC 12/26/2020 0.0  0.0 - 0.2 % Final  . Neutrophils Relative % 12/26/2020 79  % Final  . Neutro Abs 12/26/2020 4.3  1.7 - 7.7 K/uL Final  . Lymphocytes Relative 12/26/2020 10  % Final  . Lymphs Abs 12/26/2020 0.6* 0.7 - 4.0 K/uL Final  . Monocytes Relative 12/26/2020 7  % Final  . Monocytes Absolute 12/26/2020 0.4  0.1 - 1.0 K/uL Final  . Eosinophils Relative 12/26/2020 3  % Final  . Eosinophils Absolute 12/26/2020 0.2  0.0 - 0.5 K/uL Final  . Basophils Relative 12/26/2020 1  % Final  . Basophils Absolute 12/26/2020 0.0  0.0 - 0.1 K/uL Final  . Immature Granulocytes 12/26/2020 0  % Final  . Abs Immature Granulocytes 12/26/2020 0.02  0.00 - 0.07 K/uL Final   Performed at [facility], [address], [place], [place] [address]  . Path Review 12/26/2020 Blood smear is reviewed.   Final   Comment: Normal WBC count and differential. Normocytic erythrocytes, with unremarkable RBC parameters. Normal platelet count and morphology. The patient's reported history of pancytopenia is noted, although counts at this time are within normal limits. Clinical correlation is recommended. Reviewed by Kathi Simpers, M.D. Performed at [facility], [address] [place], [place] [address]     Assessment:  Timothy Kennedy is a 60 y.o. male with seronegative rheumatoid arthritis/inflammatory arthritis and pancytopenia.   Work-up during [place] admission from 11/22/2020 - 11/26/2020 revealed a ferritin 371 with an iron saturation of 16% and a TIBC 264. Sed rate was 27 (< 20) and CRP 3.34 (< 0.6)   Retic was 3.81%.  Vitamin B12 was 250 (low normal) and folate 4.9 (>6.5).  Albumen was 2.8 (low) and total  protein 6.3 (normal).  LDH was 302 (100-200).  Normal studies included:  [place], HIV testing, COVID-19 testing, TSH/free T4, PT/INR, fibrinogen.  PTT was 50.7 (elevated) and D-dimer of 2477 (< 500).   CBC on 11/26/2020 revealed a hematocrit 26.4, hemoglobin  8.6, MCV 89.0, platelets 160,000, WBC 1,900.  CBC on 12/11/2020 revealed a hematocrit 32.9, hemoglobin 10.8, MCV 90.0, platelets 189,000, WBC 3,600.  Work-up on 12/26/2020 revealed a hematocrit of 39.1, hemoglobin 13.3, platelets 232,000, and WBC 5,500 with an ANC of 4300. Normal studies included: CMP, ferritin (91), iron studies, B12 (572), folate (31.0), LDH (132), SPEP.  Sed rate was 57 and CRP 0.7.  PTT was 51 (elevated).   Peripheral smear revealed normal WBC count and differential. Erythrocytes were normocytic, with unremarkable RBC parameters. Platelet count and morphology were normal.   He has B12 deficiency and folate deficiency.  B12 was 250 and folate 4.9 on 11/22/2020.  He is on B12 2000 mcg a day and folate 1 mg a day  Chest, abdomen, and pelvis CT on 11/23/2020 revealed no etiology for patient's unintentional weight loss. There was splenomegaly (18.9 x 7.5 x 14.6 cm).  Colonoscopy on 09/12/2020 revealed one tubular adenoma and one tubulovillous adenoma.  Upper endoscopy on 11/24/2020 revealed esophagitis  and some abnormal mucosa in the duodenum. Pathology showed gastric heterotopia.   The patient has a long history of alcohol use. He typically drank 6-12 beers a day for many years and more recently liquor.  He stopped drinking alcohol on 09/18/2020.  Symptomatically, he has been "alright." Energy level is low.  He has neck, back, and shoulder pain. He has occasional diarrhea. He denies any excess bruising or bleeding.   Plan: 1.   Labs today: PTT with mix, AFP. 2.   Pancytopenia  Pancytopenia has resolved.    Hematocrit 39.1.  Hemoglobin 13.3.  Platelets 232,000.  WBC 5500 with an [place] of 4300.  Peripheral smear reveals no  abnormalities.  Etiology is felt transient and possibly due to B12 and folate deficiency which has subsequently been corrected 3.   Splenomegaly  Abdomen CT on 11/23/2020 revealed splenomegaly (18.9 x 7.5 x 14.6 cm).  Discuss abdominal ultrasound to assess spleen size.  Differential diagnosis of splenomegaly includes: liver disease/cirrhosis, leukemia, lymphoma, myeloproliferative disorders, infections and autoimmune disease.  If etiology is unclear, patient may require bone marrow aspirate and biopsy. 4.   Elevated PTT  PTT was 51 (elevated) on 12/26/2020.  Check PTT with mix to determine factor inhibitor versus deficiency. 5.   Abdomen ultrasound next week. 6.   MD:  Note to Dr Jefm Bryant ZO:[id number] arthritis. 7.   RTC after ultrasound for MD assessment and discussion regarding direction of therapy.  I discussed the assessment and treatment plan with the patient.  The patient was provided an opportunity to ask questions and all were answered.  The patient agreed with the plan and demonstrated an understanding of the instructions.  The patient was advised to call back if the symptoms worsen or if the condition fails to improve as anticipated.  I provided 22 minutes of face-to-face time during this this encounter and > 50% was spent counseling as documented under my assessment and plan. An additional 5 minutes were spent reviewing his chart (Epic and Care Everywhere) including notes, labs, and imaging studies.    Tyshana Nishida C. Mike Gip, MD, PhD    01/02/2021, 3:17 PM   I, De Burrs, am acting as scribe for [employer]. Mike Gip, MD, PhD.  I, Maurico Perrell C. Mike Gip, MD, have reviewed the above documentation for accuracy and completeness, and I agree with the above.

## 2021-01-02 ENCOUNTER — Other Ambulatory Visit: Payer: Self-pay

## 2021-01-02 ENCOUNTER — Inpatient Hospital Stay: Payer: BC Managed Care – PPO

## 2021-01-02 ENCOUNTER — Encounter: Payer: Self-pay | Admitting: Hematology and Oncology

## 2021-01-02 ENCOUNTER — Inpatient Hospital Stay (HOSPITAL_BASED_OUTPATIENT_CLINIC_OR_DEPARTMENT_OTHER): Payer: BC Managed Care – PPO | Admitting: Hematology and Oncology

## 2021-01-02 VITALS — BP 172/77 | HR 75 | Temp 97.9°F | Resp 18 | Wt 311.1 lb

## 2021-01-02 DIAGNOSIS — E538 Deficiency of other specified B group vitamins: Secondary | ICD-10-CM | POA: Diagnosis not present

## 2021-01-02 DIAGNOSIS — R161 Splenomegaly, not elsewhere classified: Secondary | ICD-10-CM

## 2021-01-02 DIAGNOSIS — D61818 Other pancytopenia: Secondary | ICD-10-CM | POA: Diagnosis not present

## 2021-01-02 DIAGNOSIS — K760 Fatty (change of) liver, not elsewhere classified: Secondary | ICD-10-CM

## 2021-01-02 DIAGNOSIS — R791 Abnormal coagulation profile: Secondary | ICD-10-CM | POA: Diagnosis not present

## 2021-01-02 NOTE — Progress Notes (Signed)
Patient states he has started having headaches again.

## 2021-01-03 LAB — AFP TUMOR MARKER: AFP, Serum, Tumor Marker: 1.8 ng/mL (ref 0.0–8.3)

## 2021-01-03 LAB — PTT FACTOR INHIBITOR (MIXING STUDY): aPTT: 29 s (ref 22.9–30.2)

## 2021-01-09 ENCOUNTER — Ambulatory Visit: Payer: BC Managed Care – PPO

## 2021-01-15 ENCOUNTER — Encounter: Payer: Self-pay | Admitting: Hematology and Oncology

## 2021-01-15 ENCOUNTER — Other Ambulatory Visit: Payer: Self-pay

## 2021-01-15 ENCOUNTER — Ambulatory Visit
Admission: RE | Admit: 2021-01-15 | Discharge: 2021-01-15 | Disposition: A | Discharge status: Home / Self Care | Payer: BC Managed Care – PPO | Source: Ambulatory Visit | Attending: Hematology and Oncology | Admitting: Hematology and Oncology

## 2021-01-15 DIAGNOSIS — R161 Splenomegaly, not elsewhere classified: Secondary | ICD-10-CM | POA: Diagnosis present

## 2021-01-15 DIAGNOSIS — K760 Fatty (change of) liver, not elsewhere classified: Secondary | ICD-10-CM | POA: Diagnosis present

## 2021-01-15 NOTE — Progress Notes (Signed)
[facility]  [address] [place], [place] [address] Phone: [phone]  Fax: [phone]   Clinic Day:  01/16/2021  Referring physician: Sharyne Peach, MD  Chief Complaint: Timothy Kennedy is a 60 y.o. male with seronegative rheumatoid arthritis/inflammatory arthritis and pancytopenia who is seen for 2 week assessment.  HPI: The patient was last seen in the hematology clinic on 01/02/2021. At that time, he felt "alright".  Energy level was described as "low".  Pancytopenia had resolved.  Peripheral smear was unremarkable.  He was not drinking alcohol. AFP was 1.8. We discussed evaluation for previously described splenomegaly on CT scan at [facility] as well as an elevated PTT.  PTT was 29.0 (normal).  Complete abdomen ultrasound on 01/15/2021 revealed probable fatty infiltration of liver.  There was no definite hepatic mass or nodularity. Spleen was normal in size (11.5 cm) and appearance. There was marked cortical thinning of both kidneys. There were no additional upper abdominal sonographic abnormalities.  During the interim, he has been "good." His energy level has improved. He takes vitamin B12 and folate. He denies any new symptoms or complaints.  The patient is comfortable returning to clinic prn.   Past Medical History:  Diagnosis Date  . Arthritis   . Diabetes mellitus without complication ([place])   . Hypertension     Past Surgical History:  Procedure Laterality Date  . CARPAL TUNNEL RELEASE Bilateral 07/2016   surgey on both hands  . NECK SURGERY  01/1998 08/2019  . SPINE SURGERY  01/1998   fusion of C5 and C6  . SPINE SURGERY  08/2014   fusion   . UMBILICAL HERNIA REPAIR  01/2003    Family History  Problem Relation Age of Onset  . Hypertension Mother   . Diabetes Mother   . Hypertension Father     Social History:  reports that he has never smoked. He has quit using smokeless tobacco.  His smokeless tobacco use included chew. He  reports previous alcohol use. He reports that he does not use drugs. His wife is Gwen. The patient is alone today.  Allergies:  Allergies  Allergen Reactions  . Atorvastatin Other (See Comments)  . Metformin Diarrhea    Current Medications: Current Outpatient Medications  Medication Sig Dispense Refill  . amLODipine (NORVASC) 5 MG tablet Take by mouth daily.    Marland Kitchen aspirin 81 MG EC tablet Take by mouth daily.    . Blood Glucose Monitoring Suppl (FIFTY50 GLUCOSE METER 2.0) w/Device KIT Use as directed E11.9    . carvedilol (COREG) 25 MG tablet Take by mouth 2 (two) times daily.    . cyanocobalamin 1000 MCG tablet Take 1,000 mcg by mouth daily.    . DULoxetine (CYMBALTA) 30 MG capsule Take by mouth daily.    . fluticasone (FLONASE) 50 MCG/ACT nasal spray Place into the nose as needed.    . folic acid (FOLVITE) 1 MG tablet Take by mouth daily.    Marland Kitchen glipiZIDE (GLUCOTROL XL) 10 MG 24 hr tablet Take by mouth 2 (two) times daily.    Marland Kitchen lisinopril (ZESTRIL) 20 MG tablet Take by mouth in the morning and at bedtime.    . Multiple Vitamin (QUINTABS) TABS Take 1 tablet by mouth daily.    Glory Rosebush VERIO test strip daily.    . pantoprazole (PROTONIX) 40 MG tablet Take by mouth daily.    . pravastatin (PRAVACHOL) 20 MG tablet Take by mouth daily.    . SUMAtriptan (IMITREX)  25 MG tablet Take by mouth as needed.    . thiamine 100 MG tablet Take by mouth daily.     No current facility-administered medications for this visit.    Review of Systems  Constitutional: Positive for weight loss (1 lb). Negative for chills, diaphoresis, fever and malaise/fatigue.       Feels "good." Energy has improved.  HENT: Negative for congestion, ear discharge, ear pain, hearing loss, nosebleeds, sinus pain, sore throat and tinnitus.   Eyes: Negative for blurred vision.  Respiratory: Negative for cough, hemoptysis, sputum production and shortness of breath.   Cardiovascular: Negative for chest pain, palpitations and  leg swelling.  Gastrointestinal: Positive for diarrhea (on and off). Negative for abdominal pain, blood in stool, constipation, heartburn, melena, nausea and vomiting.  Genitourinary: Negative for dysuria, frequency, hematuria and urgency.  Musculoskeletal: Positive for back pain, joint pain (shoulder) and neck pain. Negative for myalgias.  Skin: Negative for itching and rash.  Neurological: Negative for dizziness, tingling, sensory change, weakness and headaches.  Endo/Heme/Allergies: Does not bruise/bleed easily.  Psychiatric/Behavioral: Negative for depression and memory loss. The patient is not nervous/anxious and does not have insomnia.   All other systems reviewed and are negative.  Performance status (ECOG): 1  Vitals Blood pressure (!) 160/79, pulse 67, temperature (!) 97.2 F (36.2 C), resp. rate 16, weight (!) 310 lb 1.2 oz (140.6 kg), SpO2 98 %.   Physical Exam Vitals and nursing note reviewed.  Constitutional:      General: He is not in acute distress.    Appearance: He is not diaphoretic.  Eyes:     General: No scleral icterus.    Conjunctiva/sclera: Conjunctivae normal.  Neurological:     Mental Status: He is alert and oriented to person, place, and time.  Psychiatric:        Behavior: Behavior normal.        Thought Content: Thought content normal.        Judgment: Judgment normal.    No visits with results within 3 Day(s) from this visit.  Latest known visit with results is:  Office Visit on 01/02/2021  Component Date Value Ref Range Status  . AFP, Serum, Tumor Marker 01/02/2021 1.8  0.0 - 8.3 ng/mL Final   Comment: (NOTE) Roche Diagnostics Electrochemiluminescence Immunoassay (ECLIA) Values obtained with different assay methods or kits cannot be used interchangeably.  Results cannot be interpreted as absolute evidence of the presence or absence of malignant disease. This test is not interpretable in pregnant females. **Effective January 22, 2021 AFP, Serum,  Tumor Marker  reference interval will be changing to:          Age                Male          Male      0 -  7 days        0.0 - 75265.8   0.0 - 75265.8      8 - 30 days        0.0 - 32556.4   0.0 - 32556.4           1 month       0.0 -  1747.8   0.0 -  1600.3           2 months      0.0 -   391.1   0.0 -   550.0           3  months      0.0 -   225.8   0.0 -   339.9           4 months      0.0 -   230.6   0.0 -   230.6           5 months      0.0 -   118.0   0.0 -   234.0           6 months      0.0 -    97.2   0.0 -    97.2      7 - 11 months      0.0 -    60.3   0.0 -    60.3           1 year        0.0 -    21.4   0.0 -                              21.4           2 years       0.0 -     9.4   0.0 -    10.1      3 -  4 years       0.0 -     5.5   0.0 -     5.5           5 years       0.0 -     3.6   0.0 -     4.2      6 - 12 years       0.0 -     3.9   0.0 -     3.9     13 - 17 years       0.0 -     4.3   0.0 -     4.3     18 - 30 years       0.0 -     5.7   0.0 -     4.7     31 - 50 years       0.0 -     6.9   0.0 -     6.4     51 - 80 years       0.0 -     8.4   0.0 -     9.2         >80 years       0.0 -     6.4   0.0 -     8.7 Performed At: [facility] [employer] [address] [place], [place] [id number] Rush Farmer MD [id number]   . aPTT 01/02/2021 29.0  22.9 - 30.2 sec Final   Comment: (NOTE) The aPTT is normal so plasma mixing tests are not indicated. Performed At: [facility] [place], [place] [id number] Rush Farmer MD [id number]   . aPTT 1:1 Normal Plasma 01/02/2021 NOT PERFORMED   Final   Test not performed  . aPTT 1:1 Mix Saline 01/02/2021 NOT PERFORMED   Final   Test not performed  . aPTT 1:1 NP Mix, 60 Min,Incub. 01/02/2021 NOT PERFORMED   Final   Test not performed    Assessment:  Timothy Kennedy is a 60 y.o. male  with seronegative rheumatoid arthritis/inflammatory arthritis and pancytopenia.   Work-up during [facility]  admission from 11/22/2020 - 11/26/2020 revealed a ferritin 371 with an iron saturation of 16% and a TIBC 264. Sed rate was 27 (< 20) and CRP 3.34 (< 0.6)   Retic was 3.81%.  Vitamin B12 was 250 (low normal) and folate 4.9 (>6.5).  Albumen was 2.8 (low) and total protein 6.3 (normal).  LDH was 302 (100-200).  Normal studies included:  [place], HIV testing, COVID-19 testing, TSH/free T4, PT/INR, fibrinogen.  PTT was 50.7 (elevated) and D-dimer of 2477 (< 500).   CBC on 11/26/2020 revealed a hematocrit 26.4, hemoglobin  8.6, MCV 89.0, platelets 160,000, WBC 1,900.  CBC on 12/11/2020 revealed a hematocrit 32.9, hemoglobin 10.8, MCV 90.0, platelets 189,000, WBC 3,600.  Work-up on 12/26/2020 revealed a hematocrit of 39.1, hemoglobin 13.3, platelets 232,000, and WBC 5,500 with an ANC of 4300. Normal studies included: CMP, ferritin (91), iron studies, B12 (572), folate (31.0), LDH (132), SPEP.  Sed rate was 57 and CRP 0.7.  PTT was 51 (elevated).   Peripheral smear revealed normal WBC count and differential. Erythrocytes were normocytic, with unremarkable RBC parameters. Platelet count and morphology were normal.   He has B12 deficiency and folate deficiency.  B12 was 250 on 11/22/2020 and 572 on 12/26/2020.  Folate was 4.9 on 11/22/2020 and 31 on 12/26/2020.  He is on B12 1000 mcg a day and folate 1 mg a day.  He has a history of an elevated PTT.  PTT was 51 (elevated) on 12/26/2020 and 29 (normal) on 01/02/2021.  Chest, abdomen, and pelvis CT on 11/23/2020 revealed no etiology for patient's unintentional weight loss. There was splenomegaly (18.9 x 7.5 x 14.6 cm).  Abdominal ultrasound on 01/15/2021 revealed probable fatty infiltration of liver.  There was no definite hepatic mass or nodularity. Spleen was normal in size (11.5 cm) and appearance. There was marked cortical thinning of both kidneys. There were no additional upper abdominal sonographic abnormalities.  Colonoscopy on 09/12/2020 revealed one tubular  adenoma and one tubulovillous adenoma.  Upper endoscopy on 11/24/2020 revealed esophagitis and some abnormal mucosa in the duodenum. Pathology showed gastric heterotopia.   The patient has a long history of alcohol use. He typically drank 6-12 beers a day for many years and more recently liquor.  He stopped drinking alcohol on 09/18/2020.  Symptomatically, he feels "good." His energy level has improved. He denies any new symptoms or complaints.  Plan: 1.   Review labs from 01/02/2021.  2.   Pancytopenia, resolved  Hematocrit 39.1.  Hemoglobin 13.3.  MCV 86.3.  Platelets 232,000.  WBC 5500 with an [place] of 4300 on 12/26/2020.  Etiology felt transient and possibly related to B12 and folate deficiency which has been corrected.  Continue to monitor. 3.   Splenomegaly, resolved  Abdomen CT on 11/23/2020 revealed splenomegaly (18.9 x 7.5 x 14.6 cm).  Abdominal ultrasound on 12/26/2020 revealed a normal spleen (11.5 cm). 4.   Elevated PTT, resolved  PTT was 51 on 12/26/2020 and 29 on 01/02/2021.  No intervention needed. 5.   Fatty infiltration of the liver  Follow-up with Dr Iona Beard.  AFP was 1.8 (normal) on 01/02/2021. 6.   RTC prn.  I discussed the assessment and treatment plan with the patient.  The patient was provided an opportunity to ask questions and all were answered.  The patient agreed with the plan and demonstrated an understanding of the instructions.  The patient was advised to call back if the  symptoms worsen or if the condition fails to improve as anticipated.   Timothy Kennedy C. Mike Gip, MD, PhD    01/16/2021, 4:24 PM   I, De Burrs, am acting as scribe for [employer]. Mike Gip, MD, PhD.  I, Shanekia Latella C. Mike Gip, MD, have reviewed the above documentation for accuracy and completeness, and I agree with the above.

## 2021-01-16 ENCOUNTER — Inpatient Hospital Stay: Payer: BC Managed Care – PPO | Attending: Hematology and Oncology | Admitting: Hematology and Oncology

## 2021-01-16 ENCOUNTER — Encounter: Payer: Self-pay | Admitting: Hematology and Oncology

## 2021-01-16 VITALS — BP 160/79 | HR 67 | Temp 97.2°F | Resp 16 | Wt 310.1 lb

## 2021-01-16 DIAGNOSIS — K760 Fatty (change of) liver, not elsewhere classified: Secondary | ICD-10-CM | POA: Insufficient documentation

## 2021-01-16 DIAGNOSIS — E538 Deficiency of other specified B group vitamins: Secondary | ICD-10-CM | POA: Diagnosis not present

## 2021-01-16 DIAGNOSIS — Z79899 Other long term (current) drug therapy: Secondary | ICD-10-CM | POA: Diagnosis not present

## 2021-01-16 DIAGNOSIS — M0600 Rheumatoid arthritis without rheumatoid factor, unspecified site: Secondary | ICD-10-CM | POA: Insufficient documentation

## 2021-01-16 DIAGNOSIS — R161 Splenomegaly, not elsewhere classified: Secondary | ICD-10-CM | POA: Diagnosis not present

## 2021-01-16 DIAGNOSIS — Z8249 Family history of ischemic heart disease and other diseases of the circulatory system: Secondary | ICD-10-CM | POA: Diagnosis not present

## 2021-01-16 DIAGNOSIS — M064 Inflammatory polyarthropathy: Secondary | ICD-10-CM | POA: Insufficient documentation

## 2021-01-16 DIAGNOSIS — K209 Esophagitis, unspecified without bleeding: Secondary | ICD-10-CM | POA: Insufficient documentation

## 2021-01-16 DIAGNOSIS — D61818 Other pancytopenia: Secondary | ICD-10-CM | POA: Diagnosis present

## 2021-01-16 DIAGNOSIS — R7989 Other specified abnormal findings of blood chemistry: Secondary | ICD-10-CM | POA: Insufficient documentation

## 2021-01-16 DIAGNOSIS — Z833 Family history of diabetes mellitus: Secondary | ICD-10-CM | POA: Diagnosis not present

## 2021-01-20 DIAGNOSIS — R791 Abnormal coagulation profile: Secondary | ICD-10-CM | POA: Insufficient documentation

## 2021-01-21 DIAGNOSIS — K760 Fatty (change of) liver, not elsewhere classified: Secondary | ICD-10-CM | POA: Insufficient documentation

## 2021-10-29 ENCOUNTER — Ambulatory Visit
Admission: EM | Admit: 2021-10-29 | Discharge: 2021-10-29 | Disposition: A | Discharge status: Home / Self Care | Payer: BC Managed Care – PPO | Attending: Emergency Medicine | Admitting: Emergency Medicine

## 2021-10-29 ENCOUNTER — Encounter: Payer: Self-pay | Admitting: Emergency Medicine

## 2021-10-29 ENCOUNTER — Other Ambulatory Visit: Payer: Self-pay

## 2021-10-29 DIAGNOSIS — M7071 Other bursitis of hip, right hip: Secondary | ICD-10-CM

## 2021-10-29 DIAGNOSIS — M25551 Pain in right hip: Secondary | ICD-10-CM

## 2021-10-29 MED ORDER — PREDNISONE 10 MG (21) PO TBPK: ORAL_TABLET | Freq: Every day | ORAL | 0 refills | Status: DC

## 2021-10-29 NOTE — ED Triage Notes (Signed)
Pt presents today with c/o of right hip pain x 1 week. He denies injury.

## 2021-10-29 NOTE — ED Provider Notes (Signed)
[facility]    CSN: [id number] Arrival date & time: 10/29/21  1113      History   Chief Complaint Chief Complaint  Patient presents with   Hip Pain    right    HPI Timothy Kennedy is a 60 y.o. male.   Approximately 1 week ago patient states that he was sitting on the commode and twisted to the right and had a slight pain into his right hip area that radiated down his right leg.  Patient was able to get up and move around he done fine and the pain came back the following day.  Patient states that the pain is intermittent it comes more after a lot of movement.  Patient denies any known injury did not fall.  Patient does have chronic back pain and walks with a cane currently.  Patient is seeing an orthopedic for other back issues currently and is taking muscle relaxers to help with this.  Denies any urinary symptoms.  Patient states that he has had kidney stones in the past and he knows that this is not the similar pain.   Past Medical History:  Diagnosis Date   Arthritis    Diabetes mellitus without complication ([place])    Hypertension     Patient Active Problem List   Diagnosis Date Noted   Hepatic steatosis 01/21/2021   Elevated partial thromboplastin time (PTT) 01/20/2021   Other pancytopenia ([place]) 12/26/2020   B12 deficiency 12/26/2020   Folate deficiency 12/26/2020   Splenomegaly 12/26/2020    Past Surgical History:  Procedure Laterality Date   CARPAL TUNNEL RELEASE Bilateral 07/2016   surgey on both hands   NECK SURGERY  01/1998 08/2019   SPINE SURGERY  01/1998   fusion of C5 and C6   SPINE SURGERY  08/2014   fusion    UMBILICAL HERNIA REPAIR  01/2003       Home Medications    Prior to Admission medications   Medication Sig Start Date End Date Taking? Authorizing Provider  predniSONE (STERAPRED UNI-PAK 21 TAB) 10 MG (21) TBPK tablet Take by mouth daily. Take 6 tabs by mouth daily  for 2 days, then 5 tabs for 2 days, then 4 tabs for 2 days, then  3 tabs for 2 days, 2 tabs for 2 days, then 1 tab by mouth daily for 2 days 10/29/21  Yes Morley Kos L, NP  amLODipine (NORVASC) 5 MG tablet Take by mouth daily. 01/05/21 01/05/22  [provider]  aspirin 81 MG EC tablet Take by mouth daily. 09/14/14   [provider]  Blood Glucose Monitoring Suppl (FIFTY50 GLUCOSE METER 2.0) w/Device KIT Use as directed E11.9 04/28/19   [provider]  carvedilol (COREG) 25 MG tablet Take by mouth 2 (two) times daily. 10/10/20   [provider]  cyanocobalamin 1000 MCG tablet Take 1,000 mcg by mouth daily. 12/11/20 12/11/21  [provider]  DULoxetine (CYMBALTA) 30 MG capsule Take by mouth daily. 09/19/20   [provider]  fluticasone (FLONASE) 50 MCG/ACT nasal spray Place into the nose as needed.    [provider]  folic acid (FOLVITE) 1 MG tablet Take by mouth daily. 12/11/20 12/11/21  [provider]  glipiZIDE (GLUCOTROL XL) 10 MG 24 hr tablet Take by mouth 2 (two) times daily. 08/01/20 05/12/21  [provider]  lisinopril (ZESTRIL) 20 MG tablet Take by mouth in the morning and at bedtime. 12/17/20 12/17/21  [provider]  Multiple Vitamin (  QUINTABS) TABS Take 1 tablet by mouth daily. 11/27/20 11/27/21  [provider]  Glory Rosebush VERIO test strip daily. 12/13/20   [provider]  pantoprazole (PROTONIX) 40 MG tablet Take by mouth daily. 12/11/20 12/11/21  [provider]  pravastatin (PRAVACHOL) 20 MG tablet Take by mouth daily. 10/17/20   [provider]  SUMAtriptan (IMITREX) 25 MG tablet Take by mouth as needed. 09/19/20 09/19/21  [provider]  thiamine 100 MG tablet Take by mouth daily. 12/11/20 12/11/21  [provider]    Family History Family History  Problem Relation Age of Onset   Hypertension Mother    Diabetes Mother    Hypertension Father     Social History Social History   Tobacco Use   Smoking  status: Never   Smokeless tobacco: Former    Types: Nurse, children's Use: Never used  Substance Use Topics   Alcohol use: Not Currently    Comment: former drinker 11/21   Drug use: Never     Allergies   Atorvastatin and Metformin   Review of Systems Review of Systems  Constitutional:  Negative for activity change and fever.  HENT: Negative.    Respiratory: Negative.    Cardiovascular: Negative.   Gastrointestinal: Negative.   Genitourinary: Negative.   Musculoskeletal:        Right-sided hip pain that radiates down the right leg.  Pain is more so with movement  Skin: Negative.   Neurological: Negative.     Physical Exam Triage Vital Signs ED Triage Vitals  Enc Vitals Group     BP 10/29/21 1314 (!) 155/64     Pulse Rate 10/29/21 1314 (!) 58     Resp 10/29/21 1314 20     Temp 10/29/21 1314 98.4 F (36.9 C)     Temp Source 10/29/21 1314 Oral     SpO2 10/29/21 1314 98 %     Weight --      Height 10/29/21 1314 _0  (1.753 m)     Head Circumference --      Peak Flow --      Pain Score 10/29/21 1313 10     Pain Loc --      Pain Edu? --      Excl. in [place]? --    No data found.  Updated Vital Signs BP (!) 155/64 (BP Location: Right Arm)   Pulse (!) 58   Temp 98.4 F (36.9 C) (Oral)   Resp 20   Ht _1  (1.753 m)   SpO2 98%   BMI 45.79 kg/m   Visual Acuity Right Eye Distance:   Left Eye Distance:   Bilateral Distance:    Right Eye Near:   Left Eye Near:    Bilateral Near:     Physical Exam Constitutional:      Appearance: He is obese.  Cardiovascular:     Rate and Rhythm: Normal rate.  Pulmonary:     Effort: Pulmonary effort is normal.  Abdominal:     General: Abdomen is flat.     Tenderness: There is no abdominal tenderness. There is no right CVA tenderness or left CVA tenderness.  Musculoskeletal:        General: Tenderness present. No signs of injury.     Cervical back: Normal range of motion.     Comments: Decreased range of  motion with bending over due to pain in right hip.  Patient uses a cane for mobility and is  able to bear weight with assistance from the cane.  Skin:    Capillary Refill: Capillary refill takes 2 to 3 seconds.  Neurological:     General: No focal deficit present.     Mental Status: He is alert.     UC Treatments / Results  Labs (all labs ordered are listed, but only abnormal results are displayed) Labs Reviewed - No data to display  EKG   Radiology No results found.  Procedures Procedures (including critical care time)  Medications Ordered in UC Medications - No data to display  Initial Impression / Assessment and Plan / UC Course  I have reviewed the triage vital signs and the nursing notes.  Pertinent labs & imaging results that were available during my care of the patient were reviewed by me and considered in my medical decision making (see chart for details).     We will start you on steroids to help with the inflammation in the joint area. You are already prescribed a muscle relaxer you can continue to take that as needed. Follow-up with your orthopedic about treatment and further x-rays. You will need to rest the area and prevent excessive movement.  Use a heating pad as needed for discomfort. Take Tylenol or Motrin as needed for pain Final Clinical Impressions(s) / UC Diagnoses   Final diagnoses:  Right hip pain  Bursitis of other bursa of right hip     Discharge Instructions      We will start you on steroids to help with the inflammation in the joint area. You are already prescribed a muscle relaxer you can continue to take that as needed. Follow-up with your orthopedic about treatment and further x-rays. You will need to rest the area and prevent excessive movement.  Use a heating pad as needed for discomfort. Take Tylenol or Motrin as needed for pain     ED Prescriptions     Medication Sig Dispense Auth. Provider   predniSONE (STERAPRED UNI-PAK  21 TAB) 10 MG (21) TBPK tablet Take by mouth daily. Take 6 tabs by mouth daily  for 2 days, then 5 tabs for 2 days, then 4 tabs for 2 days, then 3 tabs for 2 days, 2 tabs for 2 days, then 1 tab by mouth daily for 2 days 42 tablet Marney Setting, NP      PDMP not reviewed this encounter.   Marney Setting, NP 10/29/21 1359

## 2021-10-29 NOTE — Discharge Instructions (Addendum)
We will start you on steroids to help with the inflammation in the joint area. You are already prescribed a muscle relaxer you can continue to take that as needed. Follow-up with your orthopedic about treatment and further x-rays. You will need to rest the area and prevent excessive movement.  Use a heating pad as needed for discomfort. Take Tylenol or Motrin as needed for pain

## 2021-12-31 ENCOUNTER — Encounter: Payer: Self-pay | Admitting: Emergency Medicine

## 2021-12-31 ENCOUNTER — Ambulatory Visit
Admission: EM | Admit: 2021-12-31 | Discharge: 2021-12-31 | Disposition: A | Discharge status: Home / Self Care | Payer: BC Managed Care – PPO | Attending: Internal Medicine | Admitting: Internal Medicine

## 2021-12-31 ENCOUNTER — Ambulatory Visit (INDEPENDENT_AMBULATORY_CARE_PROVIDER_SITE_OTHER): Payer: BC Managed Care – PPO

## 2021-12-31 ENCOUNTER — Other Ambulatory Visit: Payer: Self-pay

## 2021-12-31 DIAGNOSIS — S335XXA Sprain of ligaments of lumbar spine, initial encounter: Secondary | ICD-10-CM | POA: Diagnosis not present

## 2021-12-31 DIAGNOSIS — W19XXXA Unspecified fall, initial encounter: Secondary | ICD-10-CM

## 2021-12-31 DIAGNOSIS — M545 Low back pain, unspecified: Secondary | ICD-10-CM | POA: Diagnosis not present

## 2021-12-31 MED ORDER — MELOXICAM 7.5 MG PO TABS: 7.5000 mg | ORAL_TABLET | Freq: Every day | ORAL | 0 refills | Status: DC

## 2021-12-31 MED ORDER — KETOROLAC TROMETHAMINE 60 MG/2ML IM SOLN
30.0000 mg | Freq: Once | INTRAMUSCULAR | Status: AC
  Administered 2021-12-31: 30 mg via INTRAMUSCULAR

## 2021-12-31 MED ORDER — HYDROCODONE-ACETAMINOPHEN 5-325 MG PO TABS: 1.0000 | ORAL_TABLET | Freq: Four times a day (QID) | ORAL | 0 refills | Status: DC | PRN

## 2021-12-31 NOTE — ED Provider Notes (Signed)
[facility]    CSN: [id number] Arrival date & time: 12/31/21  5277      History   Chief Complaint Chief Complaint  Patient presents with   Fall   Back Pain    HPI Timothy Kennedy is a 61 y.o. male with a history of chronic back pain comes to urgent care with worsening back pain this morning.  Patient slipped on ice this morning.  He fell on his back.  He denies hitting his head.  No loss of consciousness.  Patient describes the pain as severe, currently 10 out of 10, sharp and throbbing.  Patient denies any relieving factors.  Pain is aggravated by movement and walking.  Patient has resorted to using a cane to help him ambulate.  He denies any radiation into the legs.  No lower extremity weakness or numbness.  No urinary or bladder problems.   HPI  Past Medical History:  Diagnosis Date   Arthritis    Diabetes mellitus without complication ([place])    Hypertension     Patient Active Problem List   Diagnosis Date Noted   Hepatic steatosis 01/21/2021   Elevated partial thromboplastin time (PTT) 01/20/2021   Other pancytopenia ([place]) 12/26/2020   B12 deficiency 12/26/2020   Folate deficiency 12/26/2020   Splenomegaly 12/26/2020    Past Surgical History:  Procedure Laterality Date   CARPAL TUNNEL RELEASE Bilateral 07/2016   surgey on both hands   NECK SURGERY  01/1998 08/2019   SPINE SURGERY  01/1998   fusion of C5 and C6   SPINE SURGERY  08/2014   fusion    UMBILICAL HERNIA REPAIR  01/2003       Home Medications    Prior to Admission medications   Medication Sig Start Date End Date Taking? Authorizing Provider  amLODipine (NORVASC) 5 MG tablet Take by mouth daily. 01/05/21 01/05/22 Yes [provider]  aspirin 81 MG EC tablet Take by mouth daily. 09/14/14  Yes [provider]  Blood Glucose Monitoring Suppl (FIFTY50 GLUCOSE METER 2.0) w/Device KIT Use as directed E11.9 04/28/19  Yes [provider]  carvedilol (COREG) 25 MG tablet  Take by mouth 2 (two) times daily. 10/10/20  Yes [provider]  DULoxetine (CYMBALTA) 30 MG capsule Take by mouth daily. 09/19/20  Yes [provider]  fluticasone (FLONASE) 50 MCG/ACT nasal spray Place into the nose as needed.   Yes [provider]  glipiZIDE (GLUCOTROL XL) 10 MG 24 hr tablet Take by mouth 2 (two) times daily. 08/01/20 12/31/21 Yes [provider]  HYDROcodone-acetaminophen (NORCO/VICODIN) 5-325 MG tablet Take 1 tablet by mouth every 6 (six) hours as needed. 12/31/21  Yes Maragret Vanacker, Myrene Galas, MD  lisinopril (ZESTRIL) 20 MG tablet Take by mouth in the morning and at bedtime. 12/17/20 12/31/21 Yes [provider]  meloxicam (MOBIC) 7.5 MG tablet Take 1 tablet (7.5 mg total) by mouth daily. 12/31/21  Yes Demesha Boorman, Myrene Galas, MD  pantoprazole (PROTONIX) 40 MG tablet Take by mouth daily. 12/11/20 12/31/21 Yes [provider]  pravastatin (PRAVACHOL) 20 MG tablet Take by mouth daily. 10/17/20  Yes [provider]  SUMAtriptan (IMITREX) 25 MG tablet Take by mouth as needed. 09/19/20 12/31/21 Yes [provider]  methocarbamol (ROBAXIN) 750 MG tablet Take 750 mg by mouth 3 (three) times daily. 12/24/21   [provider]  [facility] VERIO test strip daily. 12/13/20   [provider]    Family History Family History  Problem Relation Age  of Onset   Hypertension Mother    Diabetes Mother    Hypertension Father     Social History Social History   Tobacco Use   Smoking status: Never   Smokeless tobacco: Former    Types: Nurse, children's Use: Never used  Substance Use Topics   Alcohol use: Not Currently    Comment: former drinker 11/21   Drug use: Never     Allergies   Atorvastatin and Metformin   Review of Systems Review of Systems  Gastrointestinal: Negative.   Musculoskeletal:  Positive for back pain and myalgias. Negative for joint swelling, neck pain and neck stiffness.   Neurological: Negative.   Psychiatric/Behavioral:  Negative for confusion.     Physical Exam Triage Vital Signs ED Triage Vitals  Enc Vitals Group     BP 12/31/21 1028 (!) 147/74     Pulse Rate 12/31/21 1028 62     Resp 12/31/21 1028 18     Temp 12/31/21 1028 99.1 F (37.3 C)     Temp Source 12/31/21 1028 Oral     SpO2 12/31/21 1028 95 %     Weight 12/31/21 1026 (!) 309 lb 15.5 oz (140.6 kg)     Height 12/31/21 1026 5' 9"  (1.753 m)     Head Circumference --      Peak Flow --      Pain Score 12/31/21 1024 7     Pain Loc --      Pain Edu? --      Excl. in [place]? --    No data found.  Updated Vital Signs BP (!) 147/74 (BP Location: Left Arm)    Pulse 62    Temp 99.1 F (37.3 C) (Oral)    Resp 18    Ht 5' 9"  (1.753 m)    Wt (!) 140.6 kg    SpO2 95%    BMI 45.77 kg/m   Visual Acuity Right Eye Distance:   Left Eye Distance:   Bilateral Distance:    Right Eye Near:   Left Eye Near:    Bilateral Near:     Physical Exam Vitals and nursing note reviewed.  Constitutional:      General: He is not in acute distress.    Appearance: He is not ill-appearing.  Cardiovascular:     Rate and Rhythm: Normal rate and regular rhythm.     Pulses: Normal pulses.     Heart sounds: Normal heart sounds.  Musculoskeletal:        General: Normal range of motion.     Comments: Tenderness on palpation over the paraspinal muscle in the lumbar area.  No bruising noted.  Power is 5/5 in lower extremities.  Deep tendon reflexes 2+ in both lower extremities.  Neurological:     Mental Status: He is alert.     UC Treatments / Results  Labs (all labs ordered are listed, but only abnormal results are displayed) Labs Reviewed - No data to display  EKG   Radiology No results found.  Procedures Procedures (including critical care time)  Medications Ordered in UC Medications  ketorolac (TORADOL) injection 30 mg (30 mg Intramuscular Given 12/31/21 1138)    Initial Impression / Assessment  and Plan / UC Course  I have reviewed the triage vital signs and the nursing notes.  Pertinent labs & imaging results that were available during my care of the patient were reviewed by me and considered in my medical decision making (  see chart for details).     1.  Lower back sprain: X-ray of the lumbosacral spine is negative for acute fracture Toradol 30 mg IM x1 dose given Meloxicam 7.5 mg orally daily Hydrocodone/acetaminophen as needed for pain Heating pad use will help with back pain Continue muscle relaxant use Return precautions given. Final Clinical Impressions(s) / UC Diagnoses   Final diagnoses:  Low back sprain, initial encounter     Discharge Instructions      Gentle range of motion exercises Please take medications as prescribed Continue taking your muscle relaxants Please do not drive a vehicle or operate heavy machinery after taking muscle relaxants. X-rays negative for fracture Return to urgent care if symptoms worsen.   ED Prescriptions     Medication Sig Dispense Auth. Provider   meloxicam (MOBIC) 7.5 MG tablet Take 1 tablet (7.5 mg total) by mouth daily. 30 tablet Lurlie Wigen, Myrene Galas, MD   HYDROcodone-acetaminophen (NORCO/VICODIN) 5-325 MG tablet Take 1 tablet by mouth every 6 (six) hours as needed. 10 tablet Donne Robillard, Myrene Galas, MD      I have reviewed the PDMP during this encounter.   Chase Picket, MD 12/31/21 1157

## 2021-12-31 NOTE — Discharge Instructions (Addendum)
Gentle range of motion exercises Please take medications as prescribed Continue taking your muscle relaxants Please do not drive a vehicle or operate heavy machinery after taking muscle relaxants. X-rays negative for fracture Return to urgent care if symptoms worsen.

## 2021-12-31 NOTE — ED Triage Notes (Signed)
Pt states he fell this morning on a icy ramp at his home. He states he fell flat on his back. He is c/o lower back pain. He has chronic lower back pain at baseline. He states pain is worse with getting out of a chair and sitting down, and laying flat.

## 2022-11-10 ENCOUNTER — Ambulatory Visit
Admission: EM | Admit: 2022-11-10 | Discharge: 2022-11-10 | Disposition: A | Discharge status: Other Acute Inpatient Hospital | Payer: BC Managed Care – PPO

## 2022-11-10 DIAGNOSIS — R1011 Right upper quadrant pain: Secondary | ICD-10-CM | POA: Diagnosis not present

## 2022-11-10 DIAGNOSIS — R112 Nausea with vomiting, unspecified: Secondary | ICD-10-CM | POA: Diagnosis not present

## 2022-11-10 NOTE — Discharge Instructions (Signed)

## 2022-11-10 NOTE — ED Provider Notes (Signed)
[facility]    CSN: [id number] Arrival date & time: 11/10/22  1157      History   Chief Complaint No chief complaint on file.   HPI Timothy Kennedy is a 61 y.o. male presenting for right upper quadrant abdominal pain which is constant and severe, sharp and radiating to the right lateral thorax and back since 10 PM last night.  He says that he has taken oxycodone and it has not helped the pain.  Reports a few episodes of vomiting along with nausea.  Reports fatigue.  Reports pain started radiates all across the upper abdomen but is worse in the right upper quadrant.  Reports he had a BM yesterday which was normal.  No diarrhea.  Reports reduced appetite.  Denies any URI symptoms.  No history of any GI problems per patient.  Does have a history of hepatic steatosis and splenomegaly per his chart.  Denies any abdominal surgeries other than umbilical hernia repair in 2004.  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 Authorizing Provider  amLODipine (NORVASC) 5 MG tablet Take by mouth daily. 01/05/21 01/05/22  [provider]  aspirin 81 MG EC tablet Take by mouth daily. 09/14/14   [provider]  Blood Glucose Monitoring Suppl (FIFTY50  GLUCOSE METER 2.0) w/Device KIT Use as directed E11.9 04/28/19   [provider]  carvedilol (COREG) 25 MG tablet Take by mouth 2 (two) times daily. 10/10/20   [provider]  DULoxetine (CYMBALTA) 30 MG capsule Take by mouth daily. 09/19/20   [provider]  fluticasone (FLONASE) 50 MCG/ACT nasal spray Place into the nose as needed.    [provider]  glipiZIDE (GLUCOTROL XL) 10 MG 24 hr tablet Take by mouth 2 (two) times daily. 08/01/20 12/31/21  [provider]  HYDROcodone-acetaminophen (NORCO/VICODIN) 5-325 MG tablet Take 1 tablet by mouth every 6 (six) hours as needed. 12/31/21   Lamptey, Myrene Galas, MD  lisinopril (ZESTRIL) 20 MG tablet Take by mouth in the morning and at bedtime. 12/17/20 12/31/21  [provider]  meloxicam (MOBIC) 7.5 MG tablet Take 1 tablet (7.5 mg total) by mouth daily. 12/31/21   Lamptey, Myrene Galas, MD  methocarbamol (ROBAXIN) 750 MG tablet Take 750 mg by mouth 3 (three) times daily. 12/24/21   [provider]  [facility] VERIO test strip daily. 12/13/20   [provider]  pantoprazole (PROTONIX) 40 MG tablet Take by mouth daily. 12/11/20 12/31/21  [provider]  pravastatin (PRAVACHOL) 20 MG tablet Take by mouth daily. 10/17/20   [provider]  SUMAtriptan (IMITREX) 25 MG tablet Take by mouth as needed. 09/19/20  12/31/21  [provider]    Family History Family History  Problem Relation Age of Onset   Hypertension Mother    Diabetes Mother    Hypertension Father     Social History Social History   Tobacco Use   Smoking status: Never   Smokeless tobacco: Former    Types: Nurse, children's Use: Never used  Substance Use Topics   Alcohol use: Not Currently    Comment: former drinker 11/21   Drug use: Never     Allergies   Atorvastatin and Metformin   Review of Systems Review of Systems  Constitutional:  Positive for appetite change, chills and  fatigue. Negative for fever.  HENT:  Negative for congestion.   Respiratory:  Negative for cough and shortness of breath.   Cardiovascular:  Negative for chest pain.  Gastrointestinal:  Positive for abdominal pain, nausea and vomiting. Negative for diarrhea.  Musculoskeletal:  Positive for back pain.  Neurological:  Positive for dizziness and weakness. Negative for headaches.     Physical Exam Triage Vital Signs ED Triage Vitals  Enc Vitals Group     BP 11/10/22 1429 (!) 145/76     Pulse Rate 11/10/22 1429 79     Resp 11/10/22 1429 20     Temp 11/10/22 1428 98.1 F (36.7 C)     Temp Source 11/10/22 1428 Oral     SpO2 11/10/22 1429 99 %     Weight 11/10/22 1429 (!) 328 lb (148.8 kg)     Height 11/10/22 1429 _0  (1.753 m)     Head Circumference --      Peak Flow --      Pain Score 11/10/22 1429 9     Pain Loc --      Pain Edu? --      Excl. in [place]? --    No data found.  Updated Vital Signs BP (!) 145/76 (BP Location: Left Arm)   Pulse 79   Temp 98.1 F (36.7 C) (Oral)   Resp 20   Ht _1  (1.753 m)   Wt (!) 328 lb (148.8 kg)   SpO2 99%   BMI 48.44 kg/m     Physical Exam Vitals and nursing note reviewed.  Constitutional:      General: He is not in acute distress.    Appearance: Normal appearance. He is well-developed. He is obese. He is ill-appearing.  HENT:     Head: Normocephalic and atraumatic.     Nose: Nose normal.     Mouth/Throat:     Mouth: Mucous membranes are moist.     Pharynx: Oropharynx is clear.  Eyes:     General: No scleral icterus.    Conjunctiva/sclera: Conjunctivae normal.  Cardiovascular:     Rate and Rhythm: Normal rate and regular rhythm.     Heart sounds: No murmur heard. Pulmonary:     Effort: Pulmonary effort is normal. No respiratory distress.     Breath sounds: Normal breath sounds.  Abdominal:     General: Abdomen is protuberant.     Palpations: Abdomen is soft. There is hepatomegaly and splenomegaly.     Tenderness: There  is abdominal tenderness in the right upper quadrant and epigastric area. There is guarding. There is no right CVA tenderness or left CVA tenderness. Positive signs include Murphy's sign.  Musculoskeletal:        General: No swelling.     Cervical back: Neck supple.  Skin:  General: Skin is warm and dry.     Capillary Refill: Capillary refill takes less than 2 seconds.  Neurological:     Mental Status: He is alert.  Psychiatric:        Mood and Affect: Mood normal.      UC Treatments / Results  Labs (all labs ordered are listed, but only abnormal results are displayed) Labs Reviewed - No data to display  EKG   Radiology No results found.  Procedures Procedures (including critical care time)  Medications Ordered in UC Medications - No data to display  Initial Impression / Assessment and Plan / UC Course  I have reviewed the triage vital signs and the nursing notes.  Pertinent labs & imaging results that were available during my care of the patient were reviewed by me and considered in my medical decision making (see chart for details).   61 year old male with history of obesity, diabetes and hypertension as well as splenomegaly and hepatic steatosis presents for right upper quadrant severe abdominal pain which is sharp in nature and radiating to the right side of his back and epigastric area since 10 PM last night.  No improvement with oxycodone.  Also reports nausea and vomiting.  No change in BMs.  He has a fever.  He is ill-appearing but nontoxic.  He appears to be in pain.  His abdomen is protuberant and he is obese.  He does have significant tenderness palpation of the right upper quadrant and epigastric region.  Positive guarding the right upper quadrant.  Chest clear to auscultation.  High suspicion for cholecystitis, cholelithiasis or pancreatitis.  Also discussed possibility of kidney stones.  Advised him that he needs further and immediate workup in the emergency  department since his pain is so severe and not improved by oxycodone.  He request pain medication.  Advised him I cannot given him anything for pain as I do not want to mask his symptoms and he is to have labs and imaging performed.  Advised him that the above and most suspicious possibilities may be consistent with him needing to have GI consult and possible surgery.  Advised not waiting for the ER to go immediately.  He reports that his wife will take him to [place] at this time.  He is leaving in stable condition.   Final Clinical Impressions(s) / UC Diagnoses   Final diagnoses:  Abdominal pain, right upper quadrant  Nausea and vomiting, unspecified vomiting type     Discharge Instructions      You have been advised to follow up immediately in the emergency department for concerning signs.symptoms. If you declined EMS transport, please have a family member take you directly to the ED at this time. Do not delay. Based on concerns about condition, if you do not follow up in th e ED, you may risk poor outcomes including worsening of condition, delayed treatment and potentially life threatening issues. If you have declined to go to the ED at this time, you should call your PCP immediately to set up a follow up appointment.  Go to ED for red flag symptoms, including; fevers you cannot reduce with Tylenol/Motrin, severe headaches, vision changes, numbness/weakness in part of the body, lethargy, confusion, intractable vomiting, severe dehydration, chest pain, breathing difficulty, severe persistent abdominal or pelvic pain, signs of severe infection (increased redness, swelling of an area), feeling faint or passing out, dizziness, etc. You should especially go to the ED for sudden acute worsening of condition if  you do not elect to go at this time.     ED Prescriptions   None    PDMP not reviewed this encounter.   Danton Clap, PA-C 11/10/22 1454

## 2022-11-10 NOTE — ED Triage Notes (Signed)
Chest pain since last night.   

## 2023-04-09 NOTE — H&P (Signed)
Pre-Procedure H&P   Patient ID: Timothy Kennedy is a 62 y.o. male.  Gastroenterology Provider: [name], DO  Referring Provider: Wandra Mannan, NP PCP: Rayetta Humphrey, MD  Date: 04/10/2023  HPI Timothy Kennedy is a 62 y.o. male who presents today for Colonoscopy for Diarrhea, abdominal pain, personal history of polyps .  Patient was experiencing multiple bowel movements a day.  At peak was 9 bowel movements.  No melena or hematochezia.  Reports abdominal pain improved with the bowel movements.  Positive for nocturnal awakenings and urgency.  Had an elevated white count at peak and is now normalizing.  Hemoglobin 15.8 MCV 89 platelets 335,000 creatinine 0.8 hemorrhoids.  Fecal Cal was elevated at 183 pancreatic elastase abnormal at 120.  Infectious workup negative.  Diarrhea has since resolved with bowel movements returning to normal  Patient followed for fatty liver disease.  He is status post cholecystectomy.  He is also on duloxetine as well as Ozempic and glipizide.  He has held his Ozempic and glipizide in the setting of diarrhea. Last dose 2 weeks ago.  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  2 other tubular adenomas removed.  Colonoscopy also appreciated diverticulosis and internal   Past Medical History:  Diagnosis Date   Arthritis    Diabetes mellitus without complication (HCC)    Hypertension     Past Surgical History:  Procedure Laterality Date   CARPAL TUNNEL RELEASE Bilateral 07/2016   surgey on both hands   COLONOSCOPY     NECK SURGERY  01/1998 08/2019   SPINE SURGERY  01/1998   fusion of C5 and C6   SPINE SURGERY  08/2014   fusion    UMBILICAL HERNIA REPAIR  01/2003    Family History Father-cirrhosis secondary to alcohol No h/o GI disease or malignancy  Review of Systems  Constitutional:  Negative for activity change, appetite change, chills, diaphoresis, fatigue, fever and  unexpected weight change.  HENT:  Negative for trouble swallowing and voice change.   Respiratory:  Negative for shortness of breath and wheezing.   Cardiovascular:  Negative for chest pain, palpitations and leg swelling.  Gastrointestinal:  Positive for abdominal pain and diarrhea. Negative for abdominal distention, anal bleeding, blood in stool, constipation, nausea and vomiting.  Musculoskeletal:  Negative for arthralgias and myalgias.  Skin:  Negative for color change and pallor.  Neurological:  Negative for dizziness, syncope and weakness.  Psychiatric/Behavioral:  Negative for confusion. The patient is not nervous/anxious.   All other systems reviewed and are negative.    Medications No current facility-administered medications on file prior to encounter.   Current Outpatient Medications on File Prior to Encounter  Medication Sig Dispense Refill   amLODipine (NORVASC) 5 MG tablet Take by mouth daily.     aspirin 81 MG EC tablet Take by mouth daily.     Blood Glucose Monitoring Suppl (FIFTY50 GLUCOSE METER 2.0) w/Device KIT Use as directed E11.9     carvedilol (COREG) 25 MG tablet Take by mouth 2 (two) times daily.     DULoxetine (CYMBALTA) 30 MG capsule Take by mouth daily.     fluticasone (FLONASE) 50 MCG/ACT nasal spray Place into the nose as needed.     HYDROcodone-acetaminophen (NORCO/VICODIN) 5-325 MG tablet Take 1 tablet by mouth every 6 (six) hours as needed. 10 tablet 0   meloxicam (MOBIC) 7.5 MG tablet Take 1 tablet (7.5 mg total) by mouth daily.  30 tablet 0   methocarbamol (ROBAXIN) 750 MG tablet Take 750 mg by mouth 3 (three) times daily.     ONETOUCH VERIO test strip daily.     pravastatin (PRAVACHOL) 20 MG tablet Take by mouth daily.     Semaglutide (OZEMPIC, 2 MG/DOSE, [place]) Inject into the skin.     glipiZIDE (GLUCOTROL XL) 10 MG 24 hr tablet Take by mouth 2 (two) times daily.     lisinopril (ZESTRIL) 20 MG tablet Take by mouth in the morning and at bedtime.      pantoprazole (PROTONIX) 40 MG tablet Take by mouth daily.     SUMAtriptan (IMITREX) 25 MG tablet Take by mouth as needed.      Pertinent medications related to GI and procedure were reviewed by me with the patient prior to the procedure  No current facility-administered medications for this encounter.      Allergies  Allergen Reactions   Atorvastatin Other (See Comments)   Metformin Diarrhea   Allergies were reviewed by me prior to the procedure  Objective   Body mass index is 41.79 kg/m. Vitals:   04/10/23 1312  BP: 119/70  Pulse: 79  Resp: 18  Temp: (!) 96.7 F (35.9 C)  TempSrc: Temporal  SpO2: 97%  Weight: 128.4 kg  Height: 5\' 9"  (1.753 m)     Physical Exam Vitals and nursing note reviewed.  Constitutional:      General: He is not in acute distress.    Appearance: Normal appearance. He is obese. He is not ill-appearing, toxic-appearing or diaphoretic.  HENT:     Head: Normocephalic and atraumatic.     Nose: Nose normal.     Mouth/Throat:     Mouth: Mucous membranes are moist.     Pharynx: Oropharynx is clear.  Eyes:     General: No scleral icterus.    Extraocular Movements: Extraocular movements intact.  Cardiovascular:     Rate and Rhythm: Normal rate and regular rhythm.     Heart sounds: Normal heart sounds. No murmur heard.    No friction rub. No gallop.  Pulmonary:     Effort: Pulmonary effort is normal. No respiratory distress.     Breath sounds: Normal breath sounds. No wheezing, rhonchi or rales.  Abdominal:     General: Bowel sounds are normal. There is no distension.     Palpations: Abdomen is soft.     Tenderness: There is no abdominal tenderness. There is no guarding or rebound.  Musculoskeletal:     Cervical back: Neck supple.     Right lower leg: No edema.     Left lower leg: No edema.  Skin:    General: Skin is warm and dry.     Coloration: Skin is not jaundiced or pale.  Neurological:     General: No focal deficit present.      Mental Status: He is alert and oriented to person, place, and time. Mental status is at baseline.  Psychiatric:        Mood and Affect: Mood normal.        Behavior: Behavior normal.        Thought Content: Thought content normal.        Judgment: Judgment normal.      Assessment:  Timothy Kennedy is a 62 y.o. male  who presents today for Colonoscopy for Diarrhea, abdominal pain, personal history of polyps .  Plan:  Colonoscopy with possible intervention today  Colonoscopy with possible biopsy, control  of bleeding, polypectomy, and interventions as necessary has been discussed with the patient/patient representative. Informed consent was obtained from the patient/patient representative after explaining the indication, nature, and risks of the procedure including but not limited to death, bleeding, perforation, missed neoplasm/lesions, cardiorespiratory compromise, and reaction to medications. Opportunity for questions was given and appropriate answers were provided. Patient/patient representative has verbalized understanding is amenable to undergoing the procedure.   [name], DO  [facility]  Portions of the record may have been created with voice recognition software. Occasional wrong-word or 'sound-a-like' substitutions may have occurred due to the inherent limitations of voice recognition software.  Read the chart carefully and recognize, using context, where substitutions may have occurred.

## 2023-04-10 ENCOUNTER — Ambulatory Visit: Payer: BC Managed Care – PPO | Admitting: Anesthesiology

## 2023-04-10 ENCOUNTER — Ambulatory Visit
Admission: RE | Admit: 2023-04-10 | Discharge: 2023-04-10 | Disposition: A | Discharge status: Home / Self Care | Payer: BC Managed Care – PPO | Attending: Gastroenterology | Admitting: Gastroenterology

## 2023-04-10 ENCOUNTER — Encounter: Payer: Self-pay | Admitting: Gastroenterology

## 2023-04-10 ENCOUNTER — Encounter
Admission: RE | Disposition: A | Discharge status: Home / Self Care | Payer: Self-pay | Source: Home / Self Care | Attending: Gastroenterology

## 2023-04-10 ENCOUNTER — Other Ambulatory Visit: Payer: Self-pay

## 2023-04-10 DIAGNOSIS — K760 Fatty (change of) liver, not elsewhere classified: Secondary | ICD-10-CM | POA: Diagnosis not present

## 2023-04-10 DIAGNOSIS — R197 Diarrhea, unspecified: Secondary | ICD-10-CM | POA: Insufficient documentation

## 2023-04-10 DIAGNOSIS — D124 Benign neoplasm of descending colon: Secondary | ICD-10-CM | POA: Insufficient documentation

## 2023-04-10 DIAGNOSIS — Z6841 Body Mass Index (BMI) 40.0 and over, adult: Secondary | ICD-10-CM | POA: Insufficient documentation

## 2023-04-10 DIAGNOSIS — Z86010 Personal history of colonic polyps: Secondary | ICD-10-CM | POA: Insufficient documentation

## 2023-04-10 DIAGNOSIS — Z79899 Other long term (current) drug therapy: Secondary | ICD-10-CM | POA: Insufficient documentation

## 2023-04-10 DIAGNOSIS — K635 Polyp of colon: Secondary | ICD-10-CM | POA: Diagnosis not present

## 2023-04-10 DIAGNOSIS — Z7984 Long term (current) use of oral hypoglycemic drugs: Secondary | ICD-10-CM | POA: Diagnosis not present

## 2023-04-10 DIAGNOSIS — K5730 Diverticulosis of large intestine without perforation or abscess without bleeding: Secondary | ICD-10-CM | POA: Insufficient documentation

## 2023-04-10 DIAGNOSIS — D123 Benign neoplasm of transverse colon: Secondary | ICD-10-CM | POA: Diagnosis not present

## 2023-04-10 DIAGNOSIS — Z7985 Long-term (current) use of injectable non-insulin antidiabetic drugs: Secondary | ICD-10-CM | POA: Insufficient documentation

## 2023-04-10 DIAGNOSIS — E119 Type 2 diabetes mellitus without complications: Secondary | ICD-10-CM | POA: Diagnosis not present

## 2023-04-10 DIAGNOSIS — I10 Essential (primary) hypertension: Secondary | ICD-10-CM | POA: Diagnosis not present

## 2023-04-10 DIAGNOSIS — K640 First degree hemorrhoids: Secondary | ICD-10-CM | POA: Insufficient documentation

## 2023-04-10 HISTORY — PX: COLONOSCOPY WITH PROPOFOL: SHX5780

## 2023-04-10 SURGERY — COLONOSCOPY WITH PROPOFOL
Anesthesia: General

## 2023-04-10 MED ORDER — PROPOFOL 500 MG/50ML IV EMUL
INTRAVENOUS | Status: DC | PRN
  Administered 2023-04-10: 120 ug/kg/min via INTRAVENOUS

## 2023-04-10 MED ORDER — LIDOCAINE HCL (CARDIAC) PF 100 MG/5ML IV SOSY
PREFILLED_SYRINGE | INTRAVENOUS | Status: DC | PRN
  Administered 2023-04-10: 20 mg via INTRAVENOUS

## 2023-04-10 MED ORDER — SODIUM CHLORIDE 0.9 % IV SOLN: INTRAVENOUS | Status: DC | PRN

## 2023-04-10 MED ORDER — PROPOFOL 10 MG/ML IV BOLUS
INTRAVENOUS | Status: DC | PRN
  Administered 2023-04-10: 100 mg via INTRAVENOUS

## 2023-04-10 NOTE — Anesthesia Preprocedure Evaluation (Signed)
Anesthesia Evaluation  Patient identified by MRN, date of birth, ID band Patient awake    Reviewed: Allergy & Precautions, NPO status , Patient's Chart, lab work & pertinent test results  Airway Mallampati: II  TM Distance: >3 FB Neck ROM: full    Dental  (+) Teeth Intact   Pulmonary neg pulmonary ROS   Pulmonary exam normal breath sounds clear to auscultation       Cardiovascular Exercise Tolerance: Good hypertension, Pt. on medications negative cardio ROS Normal cardiovascular exam Rhythm:Regular Rate:Normal     Neuro/Psych negative neurological ROS  negative psych ROS   GI/Hepatic negative GI ROS, Neg liver ROS,,,  Endo/Other  negative endocrine ROSdiabetes, Well Controlled, Type 2, Oral Hypoglycemic Agents  Morbid obesity  Renal/GU negative Renal ROS  negative genitourinary   Musculoskeletal   Abdominal  (+) + obese  Peds negative pediatric ROS (+)  Hematology negative hematology ROS (+)   Anesthesia Other Findings Past Medical History: No date: Arthritis No date: Diabetes mellitus without complication (HCC) No date: Hypertension  Past Surgical History: 07/2016: CARPAL TUNNEL RELEASE; Bilateral     Comment:  surgey on both hands No date: COLONOSCOPY 01/1998 08/2019: NECK SURGERY 01/1998: SPINE SURGERY     Comment:  fusion of C5 and C6 08/2014: SPINE SURGERY     Comment:  fusion  01/2003: UMBILICAL HERNIA REPAIR  BMI    Body Mass Index: 41.79 kg/m      Reproductive/Obstetrics negative OB ROS                             Anesthesia Physical Anesthesia Plan  ASA: 3  Anesthesia Plan: General   Post-op Pain Management:    Induction: Intravenous  PONV Risk Score and Plan: Propofol infusion and TIVA  Airway Management Planned: Natural Airway  Additional Equipment:   Intra-op Plan:   Post-operative Plan:   Informed Consent: I have reviewed the patients History  and Physical, chart, labs and discussed the procedure including the risks, benefits and alternatives for the proposed anesthesia with the patient or authorized representative who has indicated his/her understanding and acceptance.     Dental Advisory Given  Plan Discussed with: CRNA and Surgeon  Anesthesia Plan Comments:        Anesthesia Quick Evaluation

## 2023-04-10 NOTE — Op Note (Addendum)
[facility] Gastroenterology Patient Name: Timothy Kennedy Procedure Date: 04/10/2023 2:51 PM MRN: [id number] Account #: [id number] Date of Birth: December 13, 1960 Admit Type: Outpatient Age: 62 Room: [facility] ENDO ROOM 1 Gender: Male Note Status: Finalized Instrument Name: Colonoscope [id number] Procedure:             Colonoscopy Indications:           Personal history of colonic polyps, Diarrhea Providers:             [name] DO, DO Referring MD:          Marylin Crosby. Greggory Stallion MD, MD (Referring MD) Medicines:             Monitored Anesthesia Care Complications:         No immediate complications. Estimated blood loss:                         Minimal. Procedure:             Pre-Anesthesia Assessment:                        - Prior to the procedure, a History and Physical was                         performed, and patient medications and allergies were                         reviewed. The patient is competent. The risks and                         benefits of the procedure and the sedation options and                         risks were discussed with the patient. All questions                         were answered and informed consent was obtained.                         Patient identification and proposed procedure were                         verified by the physician, the nurse, the                         anesthesiologist and the technician in the endoscopy                         suite. Mental Status Examination: alert and oriented.                         Airway Examination: normal oropharyngeal airway and                         neck mobility. Respiratory Examination: clear to                         auscultation. CV Examination: RRR, no murmurs, no S3  or S4. Prophylactic Antibiotics: The patient does not                         require prophylactic antibiotics. Prior                         Anticoagulants: The patient has taken no  anticoagulant                         or antiplatelet agents. ASA Grade Assessment: III - A                         patient with severe systemic disease. After reviewing                         the risks and benefits, the patient was deemed in                         satisfactory condition to undergo the procedure. The                         anesthesia plan was to use monitored anesthesia care                         (MAC). Immediately prior to administration of                         medications, the patient was re-assessed for adequacy                         to receive sedatives. The heart rate, respiratory                         rate, oxygen saturations, blood pressure, adequacy of                         pulmonary ventilation, and response to care were                         monitored throughout the procedure. The physical                         status of the patient was re-assessed after the                         procedure.                        After obtaining informed consent, the colonoscope was                         passed under direct vision. Throughout the procedure,                         the patient's blood pressure, pulse, and oxygen                         saturations were monitored continuously. The  Colonoscope was introduced through the anus and                         advanced to the the terminal ileum, with                         identification of the appendiceal orifice and IC                         valve. The colonoscopy was performed without                         difficulty. The patient tolerated the procedure well.                         The quality of the bowel preparation was evaluated                         using the BBPS [facility] Bowel Preparation Scale) with                         scores of: Right Colon = 2 (minor amount of residual                         staining, small fragments of stool and/or opaque                          liquid, but mucosa seen well), Transverse Colon = 2                         (minor amount of residual staining, small fragments of                         stool and/or opaque liquid, but mucosa seen well) and                         Left Colon = 2 (minor amount of residual staining,                         small fragments of stool and/or opaque liquid, but                         mucosa seen well). The total BBPS score equals 6. Fair                         Prep. The terminal ileum, ileocecal valve, appendiceal                         orifice, and rectum were photographed. Findings:      The perianal and digital rectal examinations were normal. Pertinent       negatives include normal sphincter tone.      The terminal ileum appeared normal. Estimated blood loss: none.      Multiple small-mouthed diverticula were found in the left colon.       Estimated blood loss was minimal.      Non-bleeding internal hemorrhoids were found during retroflexion. The  hemorrhoids were Grade I (internal hemorrhoids that do not prolapse).       Estimated blood loss: none.      Normal mucosa was found in the entire colon. Biopsies for histology were       taken with a cold forceps from the right colon and left colon for       evaluation of microscopic colitis. Estimated blood loss was minimal.      A 3 to 4 mm polyp was found in the descending colon. The polyp was       sessile. The polyp was removed with a cold snare. Resection and       retrieval were complete. Estimated blood loss was minimal.      11 sessile polyps were found in the sigmoid colon, descending colon,       transverse colon and cecum. The polyps were 1 to 2 mm in size. These       polyps were removed with a jumbo cold forceps. Resection and retrieval       were complete. Estimated blood loss was minimal.      The exam was otherwise without abnormality on direct and retroflexion       views. Impression:            - The examined  portion of the ileum was normal.                        - Diverticulosis in the left colon.                        - Non-bleeding internal hemorrhoids.                        - Normal mucosa in the entire examined colon. Biopsied.                        - One 3 to 4 mm polyp in the descending colon, removed                         with a cold snare. Resected and retrieved.                        - 11 1 to 2 mm polyps in the sigmoid colon, in the                         descending colon, in the transverse colon and in the                         cecum, removed with a jumbo cold forceps. Resected and                         retrieved.                        - The examination was otherwise normal on direct and                         retroflexion views. Recommendation:        - Patient has a contact number available for  emergencies. The signs and symptoms of potential                         delayed complications were discussed with the patient.                         Return to normal activities tomorrow. Written                         discharge instructions were provided to the patient.                        - Discharge patient to home.                        - Resume previous diet.                        - Continue present medications.                        - Await pathology results.                        - Repeat colonoscopy in 1 year for surveillance based                         on pathology results.                        - Return to GI office as previously scheduled.                        - The findings and recommendations were discussed with                         the patient. Procedure Code(s):     --- Professional ---                        [phone], Colonoscopy, flexible; with removal of                         tumor(s), polyp(s), or other lesion(s) by snare                         technique                        45380, 59, Colonoscopy, flexible;  with biopsy, single                         or multiple Diagnosis Code(s):     --- Professional ---                        K64.0, First degree hemorrhoids                        D12.4, Benign neoplasm of descending colon                        D12.5, Benign neoplasm of sigmoid colon  D12.3, Benign neoplasm of transverse colon (hepatic                         flexure or splenic flexure)                        D12.0, Benign neoplasm of cecum                        R19.7, Diarrhea, unspecified                        K57.30, Diverticulosis of large intestine without                         perforation or abscess without bleeding CPT copyright 2022 American Medical Association. All rights reserved. The codes documented in this report are preliminary and upon coder review may  be revised to meet current compliance requirements. Attending Participation:      I personally performed the entire procedure. Elfredia Nevins, DO [name] DO, DO 04/10/2023 3:36:06 PM This report has been signed electronically. Number of Addenda: 0 Note Initiated On: 04/10/2023 2:51 PM Scope Withdrawal Time: 0 hours 20 minutes 0 seconds  Total Procedure Duration: 0 hours 25 minutes 22 seconds  Estimated Blood Loss:  Estimated blood loss was minimal.      [facility]

## 2023-04-10 NOTE — Anesthesia Postprocedure Evaluation (Signed)
Anesthesia Post Note  Patient: Timothy Kennedy  Procedure(s) Performed: COLONOSCOPY WITH PROPOFOL  Patient location during evaluation: Endoscopy Anesthesia Type: General Level of consciousness: awake and alert Pain management: pain level controlled Vital Signs Assessment: post-procedure vital signs reviewed and stable Respiratory status: spontaneous breathing, nonlabored ventilation, respiratory function stable and patient connected to nasal cannula oxygen Cardiovascular status: blood pressure returned to baseline and stable Postop Assessment: no apparent nausea or vomiting Anesthetic complications: no   No notable events documented.   Last Vitals:  Vitals:   04/10/23 1312 04/10/23 1529  BP: 119/70 93/60  Pulse: 79   Resp: 18   Temp: (!) 35.9 C (!) 35.9 C  SpO2: 97%     Last Pain:  Vitals:   04/10/23 1549  TempSrc:   PainSc: 0-No pain                 Lenard Simmer

## 2023-04-10 NOTE — Transfer of Care (Signed)
Immediate Anesthesia Transfer of Care Note  Patient: Timothy Kennedy  Procedure(s) Performed: COLONOSCOPY WITH PROPOFOL  Patient Location: Endoscopy Unit  Anesthesia Type:General  Level of Consciousness: awake, alert , and oriented  Airway & Oxygen Therapy: Patient Spontanous Breathing  Post-op Assessment: Report given to RN and Post -op Vital signs reviewed and stable  Post vital signs: Reviewed  Last Vitals:  Vitals Value Taken Time  BP    Temp 99/60   Pulse 79 04/10/23 1529  Resp 12 04/10/23 1529  SpO2 96 % 04/10/23 1529  Vitals shown include unvalidated device data.  Last Pain:  Vitals:   04/10/23 1312  TempSrc: Temporal  PainSc: 0-No pain         Complications: No notable events documented.

## 2023-04-10 NOTE — Interval H&P Note (Signed)
History and Physical Interval Note: Preprocedure H&P from 04/10/23  was reviewed and there was no interval change after seeing and examining the patient.  Written consent was obtained from the patient after discussion of risks, benefits, and alternatives. Patient has consented to proceed with Colonoscopy with possible intervention   04/10/2023 2:54 PM  Timothy Kennedy  has presented today for surgery, with the diagnosis of Diarrhea,abdominal pain,hx of adenomatous colon polyps,bowel habit changes.  The various methods of treatment have been discussed with the patient and family. After consideration of risks, benefits and other options for treatment, the patient has consented to  Procedure(s): COLONOSCOPY WITH PROPOFOL (N/A) as a surgical intervention.  The patient's history has been reviewed, patient examined, no change in status, stable for surgery.  I have reviewed the patient's chart and labs.  Questions were answered to the patient's satisfaction.     [name]

## 2023-04-11 ENCOUNTER — Encounter: Payer: Self-pay | Admitting: Gastroenterology

## 2023-09-11 IMAGING — CR DG LUMBAR SPINE COMPLETE 4+V
5 series · 5 of 5 positions shown · non-contrast
Comparison: 02/18/2015

CLINICAL DATA: Low back pain after fall

EXAM:
LUMBAR SPINE - COMPLETE 4+ VIEW

[l-spine ap]
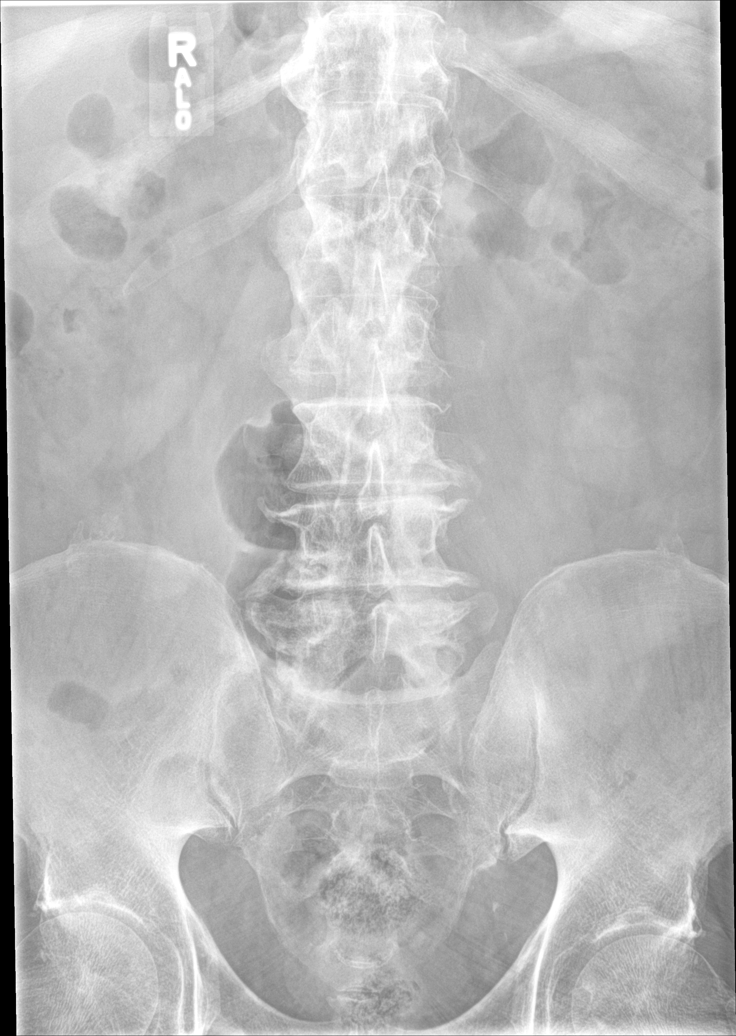

[l-spine obl (1 of 2)]
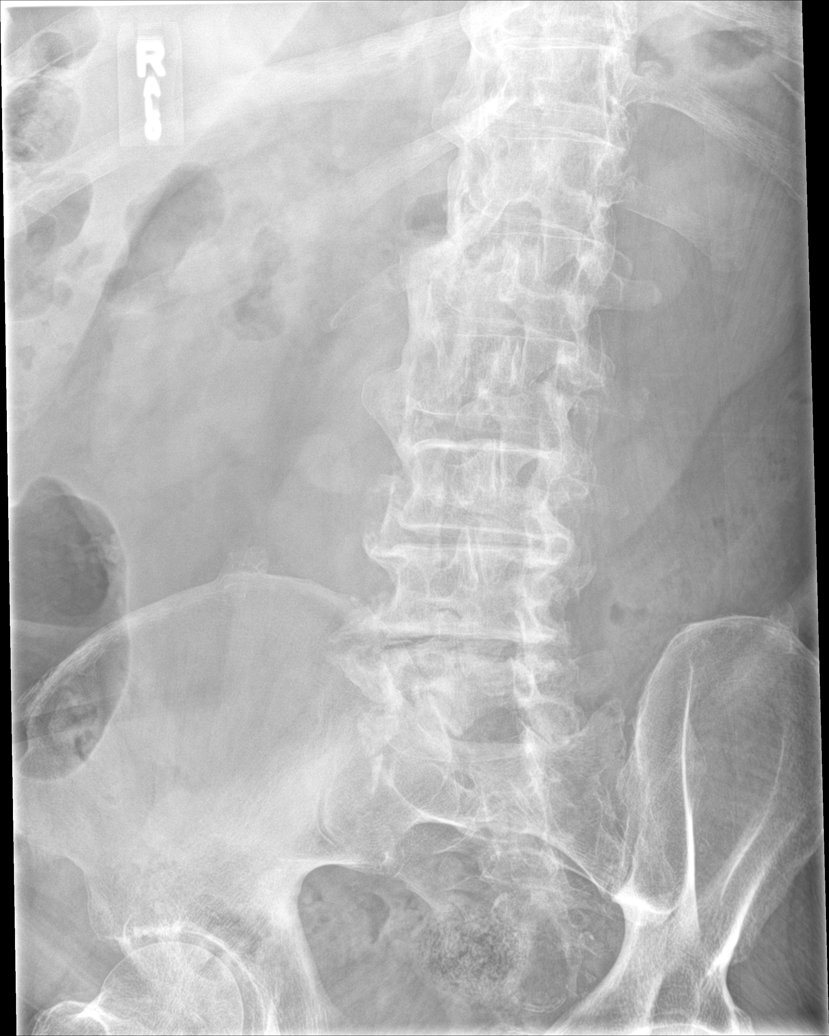

[l-spine obl (2 of 2)]
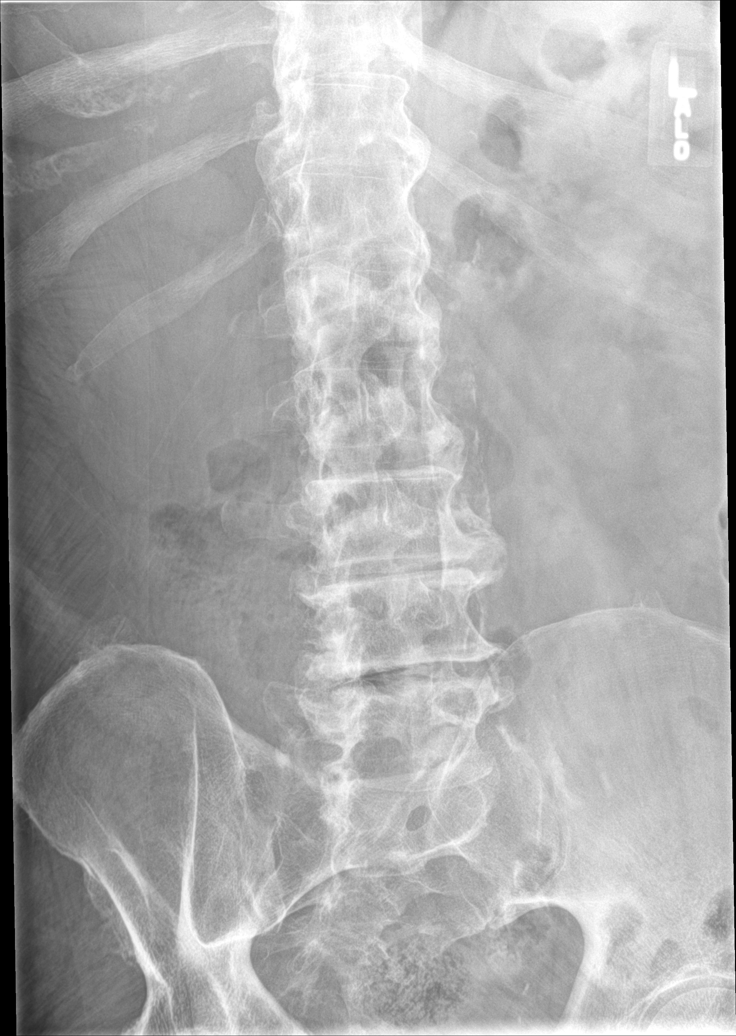

[l-spine lat]
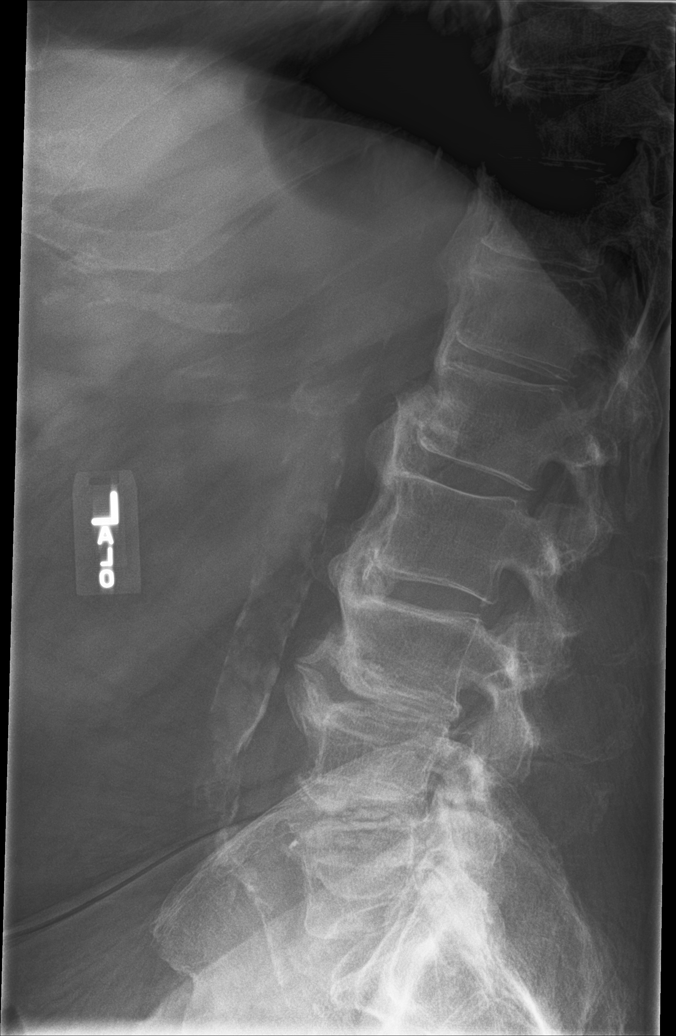

[l-spine spot]
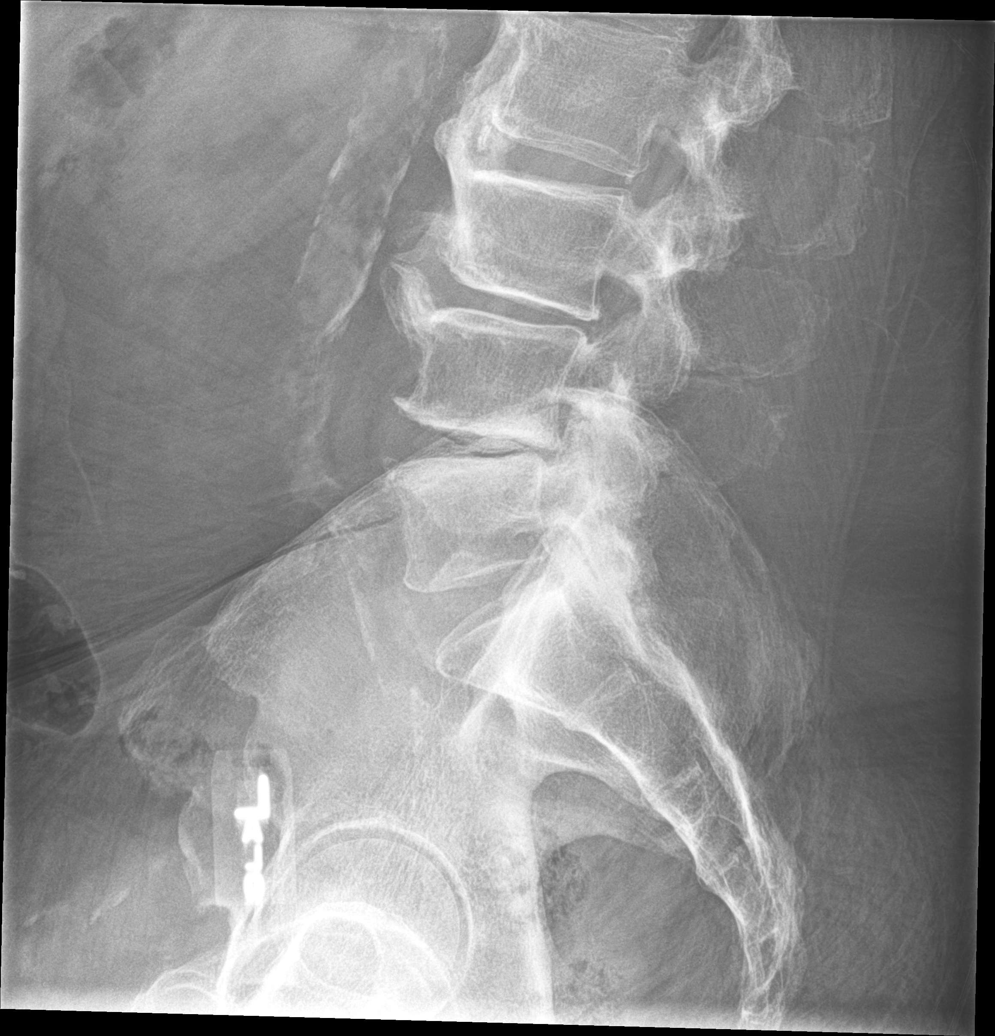

[5 of 5 positions shown; findings below may reference images not displayed]

FINDINGS: Five lumbar type vertebral segments. Vertebral body heights and
alignment are maintained. No fracture identified. Multilevel
intervertebral disc space loss with associated anterior bridging
endplate osteophytes. Lower lumbar facet arthrosis. Prominent
abdominal aortic atherosclerotic calcification.
IMPRESSION: 1. No fracture or malalignment of the lumbar spine.
2. Mild-to-moderate multilevel lumbar spondylosis.

## 2024-09-01 ENCOUNTER — Encounter: Payer: Self-pay | Admitting: Gastroenterology

## 2024-09-06 NOTE — Progress Notes (Signed)
 [facility] [address] [place], [place] [address]  Pulmonary Sleep Medicine   Office Visit Note  Patient Name: Timothy Kennedy DOB: 06-02-1961 MRN [id number]    Chief Complaint: Obstructive Sleep Apnea visit  Brief History:  Arris is seen today for an initial consult for sleep evaluation to establish care on CPAP @ 10 cmH2O. The patient has a 20 year history of sleep apnea. Prior to CPAP therapy the patient reports the following symptoms:  loud snoring and witnessed apnea. Patient is using PAP nightly.  The patient feels rested after sleeping with PAP.  The patient reports benefit from PAP use. Reported sleepiness is improved and the Epworth Sleepiness Score is 6 out of 24. The patient does not take naps. The patient complains of the following: occasional nasal congestion.  The compliance download shows  100% compliance with an average use time of 8 hours 35 minutes. The AHI is 0.7  The patient does not complain of limb movements disrupting sleep. Patient's current machine is 65 years old and malfunctioning. It is out of warranty and obsolete for repair, to assure reliable continue treatment for OSA a replacement machine is needed.  ROS  General: (-) fever, (-) chills, (-) night sweat Nose and Sinuses: (-) nasal stuffiness or itchiness, (-) postnasal drip, (-) nosebleeds, (-) sinus trouble. Mouth and Throat: (-) sore throat, (-) hoarseness. Neck: (-) swollen glands, (-) enlarged thyroid, (-) neck pain. Respiratory: - cough, - shortness of breath, - wheezing. Neurologic: + numbness, + tingling. Psychiatric: - anxiety, - depression   Current Medication: Outpatient Encounter Medications as of 09/07/2024  Medication Sig   ezetimibe (ZETIA) 10 MG tablet Take 10 mg by mouth.   hydrALAZINE (APRESOLINE) 50 MG tablet Take 50 mg by mouth 2 (two) times daily.   hydrochlorothiazide (HYDRODIURIL) 25 MG tablet Take 25 mg by mouth daily.   amLODipine (NORVASC) 5 MG tablet Take by  mouth daily.   aspirin 81 MG EC tablet Take by mouth daily.   Blood Glucose Monitoring Suppl (FIFTY50 GLUCOSE METER 2.0) w/Device KIT Use as directed E11.9   carvedilol (COREG) 25 MG tablet Take by mouth 2 (two) times daily.   DULoxetine (CYMBALTA) 30 MG capsule Take by mouth daily.   fluticasone (FLONASE) 50 MCG/ACT nasal spray Place into the nose as needed.   glipiZIDE (GLUCOTROL XL) 10 MG 24 hr tablet Take by mouth 2 (two) times daily.   lisinopril (ZESTRIL) 20 MG tablet Take by mouth in the morning and at bedtime.   methocarbamol (ROBAXIN) 750 MG tablet Take 750 mg by mouth 3 (three) times daily.   ONETOUCH VERIO test strip daily.   pantoprazole (PROTONIX) 40 MG tablet Take by mouth daily.   pravastatin (PRAVACHOL) 20 MG tablet Take by mouth daily.   SUMAtriptan (IMITREX) 25 MG tablet Take by mouth as needed.   [DISCONTINUED] HYDROcodone -acetaminophen  (NORCO/VICODIN) 5-325 MG tablet Take 1 tablet by mouth every 6 (six) hours as needed.   [DISCONTINUED] meloxicam  (MOBIC ) 7.5 MG tablet Take 1 tablet (7.5 mg total) by mouth daily.   [DISCONTINUED] Semaglutide (OZEMPIC, 2 MG/DOSE, [place]) Inject into the skin.   No facility-administered encounter medications on file as of 09/07/2024.    Surgical History: Past Surgical History:  Procedure Laterality Date   CARPAL TUNNEL RELEASE Bilateral 07/2016   surgey on both hands   CHOLECYSTECTOMY     COLONOSCOPY WITH PROPOFOL  N/A 04/10/2023   Procedure: COLONOSCOPY WITH PROPOFOL ;  Surgeon: Onita Elspeth Sharper, DO;  Location: [facility] ENDOSCOPY;  Service: Endoscopy;  Laterality: N/A;   HERNIA REPAIR     NECK SURGERY  01/1998 08/2019   NEUROPLASTY / TRANSPOSITION MEDIAN NERVE AT CARPAL TUNNEL Left    SPINE SURGERY  01/1998   fusion of C5 and C6   SPINE SURGERY  08/2014   fusion    UMBILICAL HERNIA REPAIR  01/2003    Medical History: Past Medical History:  Diagnosis Date   Abdominal aortic atherosclerosis    Arthritis    Asymmetrical  sensorineural hearing loss    Bilateral carotid artery stenosis    BPH associated with nocturia    Cervical radiculopathy    Cervical radiculopathy    Cholecystitis    Coronary artery disease    DDD (degenerative disc disease), cervical    DDD (degenerative disc disease), lumbar    Diabetes mellitus without complication (HCC)    Fatty liver disease, nonalcoholic    GERD (gastroesophageal reflux disease)    Gout    Hyperlipidemia    Hypertension    Left carpal tunnel syndrome    Lumbar radiculitis    Lumbar radiculopathy    Lumbar stenosis with neurogenic claudication    Lymphedema    Right carpal tunnel syndrome    Seronegative rheumatoid arthritis (HCC)    Sleep apnea    Stasis dermatitis of both legs    Symptomatic anemia    Trigger finger, left little finger     Family History: Non contributory to the present illness  Social History: Social History   Socioeconomic History   Marital status: Married    Spouse name: Not on file   Number of children: Not on file   Years of education: Not on file   Highest education level: Not on file  Occupational History   Not on file  Tobacco Use   Smoking status: Former    Types: Cigarettes   Smokeless tobacco: Former    Types: Engineer, Drilling   Vaping status: Never Used  Substance and Sexual Activity   Alcohol use: Yes   Drug use: Never   Sexual activity: Not Currently  Other Topics Concern   Not on file  Social History Narrative   Not on file   Social Drivers of Health   Financial Resource Strain: Low Risk  (09/02/2024)   Received from [facility]   Overall Financial Resource Strain (CARDIA)    Difficulty of Paying Living Expenses: Not very hard  Food Insecurity: No Food Insecurity (09/02/2024)   Received from [facility]   Hunger Vital Sign    Within the past 12 months, you worried that your food would run out before you got the money to buy more.: Never true    Within the  past 12 months, the food you bought just didn't last and you didn't have money to get more.: Never true  Transportation Needs: No Transportation Needs (09/02/2024)   Received from [facility] - Transportation    In the past 12 months, has lack of transportation kept you from medical appointments or from getting medications?: No    Lack of Transportation (Non-Medical): No  Physical Activity: Not on file  Stress: Not on file  Social Connections: Not on file  Intimate Partner Violence: Not on file    Vital Signs: Blood pressure (!) 158/80, pulse 66, resp. rate 18, height 5' 9 (1.753 m), weight (!) 315 lb (142.9 kg), SpO2 95%. Body mass index is 46.52 kg/m.  Examination: General Appearance: The patient is well-developed, well-nourished, and in no distress. Neck Circumference: 45 cm Skin: Gross inspection of skin unremarkable. Head: normocephalic, no gross deformities. Eyes: no gross deformities noted. ENT: ears appear grossly normal Neurologic: Alert and oriented. No involuntary movements.  STOP BANG RISK ASSESSMENT S (snore) Have you been told that you snore?     NO   T (tired) Are you often tired, fatigued, or sleepy during the day?   NO  O (obstruction) Do you stop breathing, choke, or gasp during sleep? NO   P (pressure) Do you have or are you being treated for high blood pressure? YES   B (BMI) Is your body index greater than 35 kg/m? YES   A (age) Are you 65 years old or older? YES   N (neck) Do you have a neck circumference greater than 16 inches?   YES   G (gender) Are you a male? YES   TOTAL STOP/BANG "YES" ANSWERS 5       A STOP-Bang score of 2 or less is considered low risk, and a score of 5 or more is high risk for having either moderate or severe OSA. For people who score 3 or 4, doctors may need to perform further assessment to determine how likely they are to have OSA.         EPWORTH SLEEPINESS SCALE:  Scale:  (0)= no  chance of dozing; (1)= slight chance of dozing; (2)= moderate chance of dozing; (3)= high chance of dozing  Chance  Situtation    Sitting and reading: 0    Watching TV: 1    Sitting Inactive in public: 1    As a passenger in car: 1    Lying down to rest: 3    Sitting and talking: 0    Sitting quielty after lunch: 0    In a car, stopped in traffic: 0   TOTAL SCORE:   6 out of 24    SLEEP STUDIES:  Split Study (06/2004) RDI 58.2/hr, Supine RDI 64.1/hr min SpO2 85% (not signed. BSS incomplete. Unable to see titration results) Titration (07/2004) CPAP@ 10 cmH2O   CPAP COMPLIANCE DATA:  Date Range: 06/09/2024 - 09/06/2024  Average Daily Use: 8 hours 35 minutes  Median Use: 8 hours 29 minutes  Compliance for > 4 Hours: 100% days  AHI: 0.7 respiratory events per hour  Days Used: 90/90  Mask Leak: 12.1  95th Percentile Pressure: 10 cmH2O         LABS: No results found for this or any previous visit (from the past 2160 hours).  Radiology: No results found.  No results found.  No results found.    Assessment and Plan: Patient Active Problem List   Diagnosis Date Noted   Hepatic steatosis 01/21/2021   Elevated partial thromboplastin time (PTT) 01/20/2021   Other pancytopenia (HCC) 12/26/2020   B12 deficiency 12/26/2020   Folate deficiency 12/26/2020   Splenomegaly 12/26/2020   Coronary artery disease involving native coronary artery of native heart 10/04/2020   Essential hypertension 01/30/2017   Type 2 diabetes mellitus with diabetic neuropathy, with long-term current use of insulin (HCC) 07/30/2016   Obesity, morbid, BMI 40.0-49.9 (HCC) 04/13/2015   Obstructive sleep apnea on CPAP 05/30/2014      The patient does tolerate PAP and reports benefit from PAP use. The patient was reminded how to adjust mask fit and advised to change supplies regularly. The patient was also counselled on nightly use. The compliance is  excellent. The AHI is 0.7.  Patient continues to require PAP to treat their apnea and is medically necessary. The patient's machine is past end of life and must be replaced. Updated PSG will only be done if required by insurance otherwise will move forward with replacement.   1. OSA (obstructive sleep apnea) (Primary) continue excellent compliance. Follow up 30+ days after set up  2. CPAP use counseling CPAP couseling-Discussed importance of adequate CPAP use as well as proper care and cleaning techniques of machine and all supplies.  3. Essential hypertension Continue current medication and f/u with PCP.  4. Obesity, morbid, BMI 40.0-49.9 (HCC) Obesity Counseling: Had a lengthy discussion regarding patients BMI and weight issues. Patient was instructed on portion control as well as increased activity. Also discussed caloric restrictions with trying to maintain intake less than 2000 Kcal. Discussions were made in accordance with the 5As of weight management. Simple actions such as not eating late and if able to, taking a walk is suggested.    General Counseling: I have discussed the findings of the evaluation and examination with Gladis.  I have also discussed any further diagnostic evaluation thatmay be needed or ordered today. Brain verbalizes understanding of the findings of todays visit. We also reviewed his medications today and discussed drug interactions and side effects including but not limited excessive drowsiness and altered mental states. We also discussed that there is always a risk not just to him but also people around him. he has been encouraged to call the office with any questions or concerns that should arise related to todays visit.  No orders of the defined types were placed in this encounter.       I have personally obtained a history, examined the patient, evaluated laboratory and imaging results, formulated the assessment and plan and placed orders.  This patient was seen by Tinnie Pro,  PA-C in collaboration with Dr. Elfreda Bathe as a part of collaborative care agreement.  Elfreda DELENA Bathe, MD [facility] Diplomate ABMS Pulmonary Critical Care Medicine and Sleep Medicine

## 2024-09-07 ENCOUNTER — Ambulatory Visit: Payer: Self-pay | Admitting: Internal Medicine

## 2024-09-07 VITALS — BP 158/80 | HR 66 | Resp 18 | Ht 69.0 in | Wt 315.0 lb

## 2024-09-07 DIAGNOSIS — I10 Essential (primary) hypertension: Secondary | ICD-10-CM

## 2024-09-07 DIAGNOSIS — Z7189 Other specified counseling: Secondary | ICD-10-CM | POA: Diagnosis not present

## 2024-09-07 DIAGNOSIS — G4733 Obstructive sleep apnea (adult) (pediatric): Secondary | ICD-10-CM | POA: Diagnosis not present

## 2024-09-07 NOTE — Patient Instructions (Signed)

## 2024-09-08 ENCOUNTER — Other Ambulatory Visit: Payer: Self-pay | Admitting: Neurology

## 2024-09-08 DIAGNOSIS — R413 Other amnesia: Secondary | ICD-10-CM

## 2024-09-10 ENCOUNTER — Ambulatory Visit
Admission: RE | Admit: 2024-09-10 | Discharge: 2024-09-10 | Disposition: A | Discharge status: Home / Self Care | Source: Ambulatory Visit | Attending: Neurology | Admitting: Neurology

## 2024-09-10 DIAGNOSIS — R413 Other amnesia: Secondary | ICD-10-CM | POA: Insufficient documentation

## 2024-09-16 ENCOUNTER — Encounter
Admission: RE | Disposition: A | Discharge status: Home / Self Care | Payer: Self-pay | Source: Home / Self Care | Attending: Gastroenterology

## 2024-09-16 ENCOUNTER — Ambulatory Visit
Admission: RE | Admit: 2024-09-16 | Discharge: 2024-09-16 | Disposition: A | Discharge status: Home / Self Care | Attending: Gastroenterology | Admitting: Gastroenterology

## 2024-09-16 ENCOUNTER — Ambulatory Visit: Admitting: Anesthesiology

## 2024-09-16 ENCOUNTER — Encounter: Payer: Self-pay | Admitting: Gastroenterology

## 2024-09-16 DIAGNOSIS — K2270 Barrett's esophagus without dysplasia: Secondary | ICD-10-CM | POA: Insufficient documentation

## 2024-09-16 DIAGNOSIS — K2950 Unspecified chronic gastritis without bleeding: Secondary | ICD-10-CM | POA: Insufficient documentation

## 2024-09-16 DIAGNOSIS — Z7984 Long term (current) use of oral hypoglycemic drugs: Secondary | ICD-10-CM | POA: Insufficient documentation

## 2024-09-16 DIAGNOSIS — E119 Type 2 diabetes mellitus without complications: Secondary | ICD-10-CM | POA: Diagnosis not present

## 2024-09-16 DIAGNOSIS — K224 Dyskinesia of esophagus: Secondary | ICD-10-CM | POA: Insufficient documentation

## 2024-09-16 DIAGNOSIS — I2510 Atherosclerotic heart disease of native coronary artery without angina pectoris: Secondary | ICD-10-CM | POA: Diagnosis not present

## 2024-09-16 DIAGNOSIS — R1314 Dysphagia, pharyngoesophageal phase: Secondary | ICD-10-CM | POA: Diagnosis present

## 2024-09-16 DIAGNOSIS — Z87891 Personal history of nicotine dependence: Secondary | ICD-10-CM | POA: Diagnosis not present

## 2024-09-16 DIAGNOSIS — Z1211 Encounter for screening for malignant neoplasm of colon: Secondary | ICD-10-CM | POA: Insufficient documentation

## 2024-09-16 DIAGNOSIS — K317 Polyp of stomach and duodenum: Secondary | ICD-10-CM | POA: Diagnosis not present

## 2024-09-16 DIAGNOSIS — I10 Essential (primary) hypertension: Secondary | ICD-10-CM | POA: Insufficient documentation

## 2024-09-16 DIAGNOSIS — K635 Polyp of colon: Secondary | ICD-10-CM | POA: Diagnosis not present

## 2024-09-16 DIAGNOSIS — Z860101 Personal history of adenomatous and serrated colon polyps: Secondary | ICD-10-CM | POA: Diagnosis present

## 2024-09-16 DIAGNOSIS — K5730 Diverticulosis of large intestine without perforation or abscess without bleeding: Secondary | ICD-10-CM | POA: Insufficient documentation

## 2024-09-16 DIAGNOSIS — D124 Benign neoplasm of descending colon: Secondary | ICD-10-CM | POA: Diagnosis not present

## 2024-09-16 DIAGNOSIS — Z6841 Body Mass Index (BMI) 40.0 and over, adult: Secondary | ICD-10-CM | POA: Diagnosis not present

## 2024-09-16 DIAGNOSIS — E6689 Other obesity not elsewhere classified: Secondary | ICD-10-CM | POA: Diagnosis not present

## 2024-09-16 DIAGNOSIS — K2100 Gastro-esophageal reflux disease with esophagitis, without bleeding: Secondary | ICD-10-CM | POA: Insufficient documentation

## 2024-09-16 DIAGNOSIS — G4730 Sleep apnea, unspecified: Secondary | ICD-10-CM | POA: Diagnosis not present

## 2024-09-16 DIAGNOSIS — Z79899 Other long term (current) drug therapy: Secondary | ICD-10-CM | POA: Diagnosis not present

## 2024-09-16 DIAGNOSIS — K640 First degree hemorrhoids: Secondary | ICD-10-CM | POA: Insufficient documentation

## 2024-09-16 DIAGNOSIS — D122 Benign neoplasm of ascending colon: Secondary | ICD-10-CM | POA: Insufficient documentation

## 2024-09-16 HISTORY — DX: Fatty (change of) liver, not elsewhere classified: K76.0

## 2024-09-16 HISTORY — DX: Venous insufficiency (chronic) (peripheral): I87.2

## 2024-09-16 HISTORY — PX: MALONEY DILATION: SHX5535

## 2024-09-16 HISTORY — DX: Lymphedema, not elsewhere classified: I89.0

## 2024-09-16 HISTORY — PX: COLONOSCOPY: SHX5424

## 2024-09-16 HISTORY — DX: Gout, unspecified: M10.9

## 2024-09-16 HISTORY — DX: Radiculopathy, cervical region: M54.12

## 2024-09-16 HISTORY — DX: Sensorineural hearing loss, bilateral: H90.3

## 2024-09-16 HISTORY — DX: Trigger finger, left little finger: M65.352

## 2024-09-16 HISTORY — DX: Anemia, unspecified: D64.9

## 2024-09-16 HISTORY — DX: Other cervical disc degeneration, unspecified cervical region: M50.30

## 2024-09-16 HISTORY — DX: Cholecystitis, unspecified: K81.9

## 2024-09-16 HISTORY — DX: Carpal tunnel syndrome, right upper limb: G56.01

## 2024-09-16 HISTORY — DX: Sleep apnea, unspecified: G47.30

## 2024-09-16 HISTORY — DX: Spinal stenosis, lumbar region with neurogenic claudication: M48.062

## 2024-09-16 HISTORY — DX: Gastro-esophageal reflux disease without esophagitis: K21.9

## 2024-09-16 HISTORY — DX: Other intervertebral disc degeneration, lumbar region without mention of lumbar back pain or lower extremity pain: M51.369

## 2024-09-16 HISTORY — PX: POLYPECTOMY: SHX149

## 2024-09-16 HISTORY — PX: ESOPHAGOGASTRODUODENOSCOPY: SHX5428

## 2024-09-16 HISTORY — DX: Occlusion and stenosis of bilateral carotid arteries: I65.23

## 2024-09-16 HISTORY — DX: Benign prostatic hyperplasia with lower urinary tract symptoms: N40.1

## 2024-09-16 HISTORY — DX: Radiculopathy, lumbar region: M54.16

## 2024-09-16 HISTORY — DX: Atherosclerosis of aorta: I70.0

## 2024-09-16 HISTORY — DX: Atherosclerotic heart disease of native coronary artery without angina pectoris: I25.10

## 2024-09-16 HISTORY — DX: Carpal tunnel syndrome, left upper limb: G56.02

## 2024-09-16 HISTORY — DX: Rheumatoid arthritis without rheumatoid factor, unspecified site: M06.00

## 2024-09-16 HISTORY — DX: Hyperlipidemia, unspecified: E78.5

## 2024-09-16 LAB — GLUCOSE, CAPILLARY: Glucose-Capillary: 244 mg/dL — ABNORMAL HIGH (ref 70–99)

## 2024-09-16 SURGERY — COLONOSCOPY
Anesthesia: General

## 2024-09-16 MED ORDER — GLYCOPYRROLATE 0.2 MG/ML IJ SOLN
INTRAMUSCULAR | Status: DC | PRN
  Administered 2024-09-16: .2 mg via INTRAVENOUS

## 2024-09-16 MED ORDER — EPHEDRINE SULFATE-NACL 50-0.9 MG/10ML-% IV SOSY
PREFILLED_SYRINGE | INTRAVENOUS | Status: DC | PRN
  Administered 2024-09-16: 5 mg via INTRAVENOUS

## 2024-09-16 MED ORDER — SODIUM CHLORIDE 0.9 % IV SOLN
INTRAVENOUS | Status: DC
  Administered 2024-09-16: 500 mL via INTRAVENOUS

## 2024-09-16 MED ORDER — PROPOFOL 500 MG/50ML IV EMUL
INTRAVENOUS | Status: DC | PRN
  Administered 2024-09-16: 145 ug/kg/min via INTRAVENOUS

## 2024-09-16 MED ORDER — PROPOFOL 10 MG/ML IV BOLUS
INTRAVENOUS | Status: DC | PRN
Start: 2024-09-16 — End: 2024-09-16
  Administered 2024-09-16: 60 mg via INTRAVENOUS
  Administered 2024-09-16: 40 mg via INTRAVENOUS

## 2024-09-16 MED ORDER — LIDOCAINE HCL (CARDIAC) PF 100 MG/5ML IV SOSY
PREFILLED_SYRINGE | INTRAVENOUS | Status: DC | PRN
  Administered 2024-09-16: 100 mg via INTRAVENOUS

## 2024-09-16 MED ORDER — DEXMEDETOMIDINE HCL IN NACL 200 MCG/50ML IV SOLN
INTRAVENOUS | Status: DC | PRN
  Administered 2024-09-16 (×2): 12 ug via INTRAVENOUS

## 2024-09-16 NOTE — Anesthesia Postprocedure Evaluation (Signed)
 Anesthesia Post Note  Patient: Timothy Kennedy  Procedure(s) Performed: COLONOSCOPY EGD (ESOPHAGOGASTRODUODENOSCOPY)  Patient location during evaluation: PACU Anesthesia Type: General Level of consciousness: awake Pain management: satisfactory to patient Vital Signs Assessment: post-procedure vital signs reviewed and stable Respiratory status: spontaneous breathing Cardiovascular status: stable Anesthetic complications: no   No notable events documented.   Last Vitals:  Vitals:   09/16/24 1040 09/16/24 1050  BP: (!) 105/53 113/66  Pulse: 73 71  Resp: (!) 26 (!) 25  Temp:    SpO2: 98% 95%    Last Pain:  Vitals:   09/16/24 1050  TempSrc:   PainSc: 0-No pain                 VAN STAVEREN,Treshon Stannard

## 2024-09-16 NOTE — Anesthesia Preprocedure Evaluation (Addendum)
 Anesthesia Evaluation  Patient identified by MRN, date of birth, ID band Patient awake    Reviewed: Allergy & Precautions, NPO status , Patient's Chart, lab work & pertinent test results  Airway Mallampati: III  TM Distance: >3 FB Neck ROM: Full    Dental  (+) Teeth Intact, Caps,    Pulmonary sleep apnea and Continuous Positive Airway Pressure Ventilation , Patient abstained from smoking., former smoker   Pulmonary exam normal  + decreased breath sounds      Cardiovascular Exercise Tolerance: Good hypertension, Pt. on medications + CAD  Normal cardiovascular exam Rhythm:Regular Rate:Normal     Neuro/Psych negative neurological ROS  negative psych ROS   GI/Hepatic negative GI ROS, Neg liver ROS,GERD  Medicated,,  Endo/Other  diabetes, Type 2, Oral Hypoglycemic Agents  Class 4 obesity  Renal/GU negative Renal ROS  negative genitourinary   Musculoskeletal   Abdominal  (+) + obese  Peds negative pediatric ROS (+)  Hematology negative hematology ROS (+)   Anesthesia Other Findings Past Medical History: No date: Abdominal aortic atherosclerosis No date: Arthritis No date: Asymmetrical sensorineural hearing loss No date: Bilateral carotid artery stenosis No date: BPH associated with nocturia No date: Cervical radiculopathy No date: Cervical radiculopathy No date: Cholecystitis No date: Coronary artery disease No date: DDD (degenerative disc disease), cervical No date: DDD (degenerative disc disease), lumbar No date: Diabetes mellitus without complication (HCC) No date: Fatty liver disease, nonalcoholic No date: GERD (gastroesophageal reflux disease) No date: Gout No date: Hyperlipidemia No date: Hypertension No date: Left carpal tunnel syndrome No date: Lumbar radiculitis No date: Lumbar radiculopathy No date: Lumbar stenosis with neurogenic claudication No date: Lymphedema No date: Right carpal tunnel  syndrome No date: Seronegative rheumatoid arthritis (HCC) No date: Sleep apnea No date: Stasis dermatitis of both legs No date: Symptomatic anemia No date: Trigger finger, left little finger  Past Surgical History: 07/2016: CARPAL TUNNEL RELEASE; Bilateral     Comment:  surgey on both hands No date: CHOLECYSTECTOMY 04/10/2023: COLONOSCOPY WITH PROPOFOL ; N/A     Comment:  Procedure: COLONOSCOPY WITH PROPOFOL ;  Surgeon: Onita Elspeth Sharper, DO;  Location: [facility] ENDOSCOPY;  Service:               Endoscopy;  Laterality: N/A; No date: HERNIA REPAIR 01/1998 08/2019: NECK SURGERY No date: NEUROPLASTY / TRANSPOSITION MEDIAN NERVE AT CARPAL TUNNEL;  Left 01/1998: SPINE SURGERY     Comment:  fusion of C5 and C6 08/2014: SPINE SURGERY     Comment:  fusion  01/2003: UMBILICAL HERNIA REPAIR     Reproductive/Obstetrics negative OB ROS                              Anesthesia Physical Anesthesia Plan  ASA: 3  Anesthesia Plan: General   Post-op Pain Management:    Induction: Intravenous  PONV Risk Score and Plan: Propofol  infusion and TIVA  Airway Management Planned: Natural Airway and Nasal Cannula  Additional Equipment:   Intra-op Plan:   Post-operative Plan:   Informed Consent: I have reviewed the patients History and Physical, chart, labs and discussed the procedure including the risks, benefits and alternatives for the proposed anesthesia with the patient or authorized representative who has indicated his/her understanding and acceptance.     Dental Advisory Given  Plan Discussed with: CRNA  Anesthesia Plan Comments:  Anesthesia Quick Evaluation

## 2024-09-16 NOTE — H&P (Signed)
 Pre-Procedure H&P   Patient ID: Timothy Kennedy is a 63 y.o. male.  Gastroenterology Provider: Elspeth Ozell Jungling, DO  Referring Provider: Delmar Gails, NP PCP: Timothy Idelia LABOR, MD  Date: 09/16/2024  HPI [name] is a 63 y.o. male who presents today for Esophagogastroduodenoscopy and Colonoscopy for Dysphagia, GERD, personal history of colon polyps .  Patient reporting dysphagia to solids with occasional discomfort.  Reflux overall controlled  EGD in January 2022 with grade a esophagitis.  Biopsies of the esophagus were negative Colonoscopy 1020 2110 mm TVA in the appendix with an additional tubular adenoma  Colonoscopy Apr 07 2411 tubular adenomas removed in the setting of fair prep.  Random biopsies negative for microscopic colitis.  Normal TI, left-sided diverticulosis and internal hemorrhoids  Mounjaro held for procedure History of C-spine fusion Hemoglobin 15 MCV 91 platelets 152,000 creatinine 1.1   Past Medical History:  Diagnosis Date   Abdominal aortic atherosclerosis    Arthritis    Asymmetrical sensorineural hearing loss    Bilateral carotid artery stenosis    BPH associated with nocturia    Cervical radiculopathy    Cervical radiculopathy    Cholecystitis    Coronary artery disease    DDD (degenerative disc disease), cervical    DDD (degenerative disc disease), lumbar    Diabetes mellitus without complication (HCC)    Fatty liver disease, nonalcoholic    GERD (gastroesophageal reflux disease)    Gout    Hyperlipidemia    Hypertension    Left carpal tunnel syndrome    Lumbar radiculitis    Lumbar radiculopathy    Lumbar stenosis with neurogenic claudication    Lymphedema    Right carpal tunnel syndrome    Seronegative rheumatoid arthritis (HCC)    Sleep apnea    Stasis dermatitis of both legs    Symptomatic anemia    Trigger finger, left little finger     Past Surgical History:  Procedure Laterality Date   CARPAL TUNNEL  RELEASE Bilateral 07/2016   surgey on both hands   CHOLECYSTECTOMY     COLONOSCOPY WITH PROPOFOL  N/A 04/10/2023   Procedure: COLONOSCOPY WITH PROPOFOL ;  Surgeon: Kennedy Timothy Ozell, DO;  Location: [facility] ENDOSCOPY;  Service: Endoscopy;  Laterality: N/A;   HERNIA REPAIR     NECK SURGERY  01/1998 08/2019   NEUROPLASTY / TRANSPOSITION MEDIAN NERVE AT CARPAL TUNNEL Left    SPINE SURGERY  01/1998   fusion of C5 and C6   SPINE SURGERY  08/2014   fusion    UMBILICAL HERNIA REPAIR  01/2003    Family History No h/o GI disease or malignancy  Review of Systems  Constitutional:  Negative for activity change, appetite change, chills, diaphoresis, fatigue, fever and unexpected weight change.  HENT:  Positive for trouble swallowing. Negative for voice change.   Respiratory:  Negative for shortness of breath and wheezing.   Cardiovascular:  Negative for chest pain, palpitations and leg swelling.  Gastrointestinal:  Negative for abdominal distention, abdominal pain, anal bleeding, blood in stool, constipation, diarrhea, nausea and vomiting.  Musculoskeletal:  Negative for arthralgias and myalgias.  Skin:  Negative for color change and pallor.  Neurological:  Negative for dizziness, syncope and weakness.  Psychiatric/Behavioral:  Negative for confusion. The patient is not nervous/anxious.   All other systems reviewed and are negative.    Medications No current facility-administered medications on file prior to encounter.   Current Outpatient Medications on File Prior to Encounter  Medication Sig Dispense  Refill   aspirin 81 MG EC tablet Take by mouth daily.     Blood Glucose Monitoring Suppl (FIFTY50 GLUCOSE METER 2.0) w/Device KIT Use as directed E11.9     carvedilol (COREG) 25 MG tablet Take by mouth 2 (two) times daily.     DULoxetine (CYMBALTA) 30 MG capsule Take by mouth daily.     fluticasone (FLONASE) 50 MCG/ACT nasal spray Place into the nose as needed.     methocarbamol (ROBAXIN) 750  MG tablet Take 750 mg by mouth 3 (three) times daily.     ONETOUCH VERIO test strip daily.     pravastatin (PRAVACHOL) 20 MG tablet Take by mouth daily.     amLODipine (NORVASC) 5 MG tablet Take by mouth daily.     glipiZIDE (GLUCOTROL XL) 10 MG 24 hr tablet Take by mouth 2 (two) times daily.     lisinopril (ZESTRIL) 20 MG tablet Take by mouth in the morning and at bedtime.     pantoprazole (PROTONIX) 40 MG tablet Take by mouth daily.     SUMAtriptan (IMITREX) 25 MG tablet Take by mouth as needed.      Pertinent medications related to GI and procedure were reviewed by me with the patient prior to the procedure   Current Facility-Administered Medications:    0.9 %  sodium chloride  infusion, , Intravenous, Continuous, Onita Timothy Sharper, DO, Last Rate: 20 mL/hr at 09/16/24 0936, 500 mL at 09/16/24 9063  sodium chloride  500 mL (09/16/24 0936)       Allergies  Allergen Reactions   Atorvastatin Other (See Comments)   Metformin Diarrhea   Allergies were reviewed by me prior to the procedure  Objective   Body mass index is 45.78 kg/m. Vitals:   09/16/24 0919  BP: 136/62  Pulse: 71  Temp: 97.6 F (36.4 C)  TempSrc: Temporal  SpO2: 95%  Weight: (!) 140.6 kg  Height: 5' 9 (1.753 m)     Physical Exam Vitals and nursing note reviewed.  Constitutional:      General: He is not in acute distress.    Appearance: Normal appearance. He is obese. He is not ill-appearing, toxic-appearing or diaphoretic.  HENT:     Head: Normocephalic and atraumatic.     Nose: Nose normal.     Mouth/Throat:     Mouth: Mucous membranes are moist.     Pharynx: Oropharynx is clear.  Eyes:     General: No scleral icterus.    Extraocular Movements: Extraocular movements intact.  Cardiovascular:     Rate and Rhythm: Normal rate and regular rhythm.     Heart sounds: Normal heart sounds. No murmur heard.    No friction rub. No gallop.  Pulmonary:     Effort: Pulmonary effort is normal. No  respiratory distress.     Breath sounds: Normal breath sounds. No wheezing, rhonchi or rales.  Abdominal:     General: Bowel sounds are normal. There is no distension.     Palpations: Abdomen is soft.     Tenderness: There is no abdominal tenderness. There is no guarding or rebound.  Musculoskeletal:     Cervical back: Neck supple.     Right lower leg: No edema.     Left lower leg: No edema.  Skin:    General: Skin is warm and dry.     Coloration: Skin is not jaundiced or pale.  Neurological:     General: No focal deficit present.     Mental Status: He is  alert and oriented to person, place, and time. Mental status is at baseline.  Psychiatric:        Mood and Affect: Mood normal.        Behavior: Behavior normal.        Thought Content: Thought content normal.        Judgment: Judgment normal.      Assessment:  [name] is a 63 y.o. male  who presents today for Esophagogastroduodenoscopy and Colonoscopy for Dysphagia, GERD, personal history of colon polyps .  Plan:  Esophagogastroduodenoscopy and Colonoscopy with possible intervention today  Esophagogastroduodenoscopy and Colonoscopy with possible biopsy, control of bleeding, polypectomy, and interventions as necessary has been discussed with the patient/patient representative. Informed consent was obtained from the patient/patient representative after explaining the indication, nature, and risks of the procedure including but not limited to death, bleeding, perforation, missed neoplasm/lesions, cardiorespiratory compromise, and reaction to medications. Opportunity for questions was given and appropriate answers were provided. Patient/patient representative has verbalized understanding is amenable to undergoing the procedure.   Timothy Ozell Jungling, DO  [facility]astroenter[facility]  Portions of the record may have been created with voice recognition software. Occasional wrong-word or 'sound-a-like' substitutions  may have occurred due to the inherent limitations of voice recognition software.  Read the chart carefully and recognize, using context, where substitutions may have occurred.

## 2024-09-16 NOTE — Op Note (Signed)
 [facility] Gastroenterology Patient Name: Timothy Kennedy Procedure Date: 09/16/2024 9:44 AM MRN: [id number] Account #: [id number] Date of Birth: [birth year] Admit Type: Outpatient Age: 63 Room: [facility] ENDO ROOM 1 Gender: Male Note Status: Finalized Instrument Name: Colon Scope [id number] Procedure:             Colonoscopy Indications:           High risk colon cancer surveillance: Personal history                         of colonic polyps Providers:             Elspeth Ozell Jungling DO, DO Referring MD:          Sionne A. Zachary MD, MD (Referring MD) Medicines:             Monitored Anesthesia Care Complications:         No immediate complications. Estimated blood loss:                         Minimal. Procedure:             Pre-Anesthesia Assessment:                        - Prior to the procedure, a History and Physical was                         performed, and patient medications and allergies were                         reviewed. The patient is competent. The risks and                         benefits of the procedure and the sedation options and                         risks were discussed with the patient. All questions                         were answered and informed consent was obtained.                         Patient identification and proposed procedure were                         verified by the physician, the nurse, the                         anesthesiologist and the technician in the endoscopy                         suite. Mental Status Examination: alert and oriented.                         Airway Examination: normal oropharyngeal airway and                         neck mobility. Respiratory Examination: clear to  auscultation. CV Examination: RRR, no murmurs, no S3                         or S4. Prophylactic Antibiotics: The patient does not                         require prophylactic antibiotics. Prior                          Anticoagulants: The patient has taken no anticoagulant                         or antiplatelet agents. ASA Grade Assessment: III - A                         patient with severe systemic disease. After reviewing                         the risks and benefits, the patient was deemed in                         satisfactory condition to undergo the procedure. The                         anesthesia plan was to use monitored anesthesia care                         (MAC). Immediately prior to administration of                         medications, the patient was re-assessed for adequacy                         to receive sedatives. The heart rate, respiratory                         rate, oxygen saturations, blood pressure, adequacy of                         pulmonary ventilation, and response to care were                         monitored throughout the procedure. The physical                         status of the patient was re-assessed after the                         procedure.                        After obtaining informed consent, the colonoscope was                         passed under direct vision. Throughout the procedure,                         the patient's blood pressure, pulse, and oxygen  saturations were monitored continuously. The                         Colonoscope was introduced through the anus and                         advanced to the the cecum, identified by appendiceal                         orifice and ileocecal valve. The colonoscopy was                         performed without difficulty. The patient tolerated                         the procedure well. The quality of the bowel                         preparation was evaluated using the BBPS [facility] Bowel                         Preparation Scale) with scores of: Right Colon = 2                         (minor amount of residual staining, small fragments of                         stool  and/or opaque liquid, but mucosa seen well),                         Transverse Colon = 3 (entire mucosa seen well with no                         residual staining, small fragments of stool or opaque                         liquid) and Left Colon = 2 (minor amount of residual                         staining, small fragments of stool and/or opaque                         liquid, but mucosa seen well). The total BBPS score                         equals 7. The quality of the bowel preparation was                         good. The ileocecal valve, appendiceal orifice, and                         rectum were photographed. Findings:      The perianal and digital rectal examinations were normal. Pertinent       negatives include normal sphincter tone.      Multiple small-mouthed diverticula were found in the left colon.       Estimated blood loss: none.  Non-bleeding internal hemorrhoids were found during retroflexion. The       hemorrhoids were Grade I (internal hemorrhoids that do not prolapse).       Estimated blood loss: none.      Seven sessile polyps were found in the descending colon, transverse       colon and ascending colon. The polyps were 1 to 3 mm in size. These       polyps were removed with a jumbo cold forceps. Resection and retrieval       were complete. Estimated blood loss was minimal.      The exam was otherwise without abnormality on direct and retroflexion       views. Impression:            - Diverticulosis in the left colon.                        - Non-bleeding internal hemorrhoids.                        - Seven 1 to 3 mm polyps in the descending colon, in                         the transverse colon and in the ascending colon,                         removed with a jumbo cold forceps. Resected and                         retrieved.                        - The examination was otherwise normal on direct and                         retroflexion  views. Recommendation:        - Patient has a contact number available for                         emergencies. The signs and symptoms of potential                         delayed complications were discussed with the patient.                         Return to normal activities tomorrow. Written                         discharge instructions were provided to the patient.                        - Discharge patient to home.                        - Resume previous diet.                        - Continue present medications.                        - Await pathology results.                        -  Repeat colonoscopy for surveillance based on                         pathology results.                        - Return to referring physician as previously                         scheduled. Procedure Code(s):     --- Professional ---                        [phone], Colonoscopy, flexible; with biopsy, single or                         multiple Diagnosis Code(s):     --- Professional ---                        Z86.010, Personal history of colonic polyps                        K64.0, First degree hemorrhoids                        D12.4, Benign neoplasm of descending colon                        D12.3, Benign neoplasm of transverse colon (hepatic                         flexure or splenic flexure)                        D12.2, Benign neoplasm of ascending colon                        K57.30, Diverticulosis of large intestine without                         perforation or abscess without bleeding CPT copyright 2022 American [employer]. All rights reserved. The codes documented in this report are preliminary and upon coder review may  be revised to meet current compliance requirements. Attending Participation:      I personally performed the entire procedure. Elspeth Jungling, DO Elspeth Ozell Jungling DO, DO 09/16/2024 10:39:06 AM This report has been signed electronically. Number of  Addenda: 0 Note Initiated On: 09/16/2024 9:44 AM Scope Withdrawal Time: 0 hours 16 minutes 20 seconds  Total Procedure Duration: 0 hours 20 minutes 5 seconds  Estimated Blood Loss:  Estimated blood loss was minimal.      [facility]

## 2024-09-16 NOTE — Interval H&P Note (Signed)
 History and Physical Interval Note: Preprocedure H&P from 09/16/24  was reviewed and there was no interval change after seeing and examining the patient.  Written consent was obtained from the patient after discussion of risks, benefits, and alternatives. Patient has consented to proceed with Esophagogastroduodenoscopy and Colonoscopy with possible intervention   09/16/2024 9:42 AM  Timothy Kennedy  has presented today for surgery, with the diagnosis of History of adenomatous polyp of colon [Z86.0101] Gastroesophageal reflux disease with esophagitis without hemorrhage [K21.00] Pharyngoesophageal dysphagia [R13.14].  The various methods of treatment have been discussed with the patient and family. After consideration of risks, benefits and other options for treatment, the patient has consented to  Procedure(s) with comments: COLONOSCOPY (N/A) - MOUNJARO EGD (ESOPHAGOGASTRODUODENOSCOPY) (N/A) as a surgical intervention.  The patient's history has been reviewed, patient examined, no change in status, stable for surgery.  I have reviewed the patient's chart and labs.  Questions were answered to the patient's satisfaction.     Elspeth Ozell Jungling

## 2024-09-16 NOTE — Transfer of Care (Signed)
 Immediate Anesthesia Transfer of Care Note  Patient: Timothy Kennedy  Procedure(s) Performed: COLONOSCOPY EGD (ESOPHAGOGASTRODUODENOSCOPY)  Patient Location: Endoscopy Unit  Anesthesia Type:General  Level of Consciousness: drowsy and patient cooperative  Airway & Oxygen Therapy: Patient Spontanous Breathing and Patient connected to face mask oxygen  Post-op Assessment: Report given to RN and Post -op Vital signs reviewed and stable  Post vital signs: Reviewed and stable  Last Vitals:  Vitals Value Taken Time  BP    Temp 35.9 C 09/16/24 10:30  Pulse    Resp    SpO2      Last Pain:  Vitals:   09/16/24 1030  TempSrc: Temporal  PainSc: 0-No pain         Complications: No notable events documented.

## 2024-09-16 NOTE — Anesthesia Procedure Notes (Addendum)
 Procedure Name: General with mask airway Date/Time: 09/16/2024 9:49 AM  Performed by: Ledora Duncan, CRNAPre-anesthesia Checklist: Patient identified, Emergency Drugs available, Suction available and Patient being monitored Patient Re-evaluated:Patient Re-evaluated prior to induction Oxygen Delivery Method: Simple face mask Induction Type: IV induction Placement Confirmation: positive ETCO2 and breath sounds checked- equal and bilateral Dental Injury: Teeth and Oropharynx as per pre-operative assessment

## 2024-09-16 NOTE — Op Note (Signed)
 [facility] Gastroenterology Patient Name: Timothy Kennedy Procedure Date: 09/16/2024 9:45 AM MRN: [id number] Account #: [id number] Date of Birth: Jul 26, 1961 Admit Type: Outpatient Age: 63 Room: [facility] ENDO ROOM 1 Gender: Male Note Status: Finalized Instrument Name: Upper GI Scope [id number] Procedure:             Upper GI endoscopy Indications:           gerd, pharyngoesophageal dysphagia Providers:             Elspeth Ozell Jungling DO, DO Referring MD:          Sionne A. Zachary MD, MD (Referring MD) Medicines:             Monitored Anesthesia Care Complications:         No immediate complications. Estimated blood loss:                         Minimal. Procedure:             Pre-Anesthesia Assessment:                        - Prior to the procedure, a History and Physical was                         performed, and patient medications and allergies were                         reviewed. The patient is competent. The risks and                         benefits of the procedure and the sedation options and                         risks were discussed with the patient. All questions                         were answered and informed consent was obtained.                         Patient identification and proposed procedure were                         verified by the physician, the nurse, the anesthetist                         and the technician in the endoscopy suite. Mental                         Status Examination: alert and oriented. Airway                         Examination: normal oropharyngeal airway and neck                         mobility. Respiratory Examination: clear to                         auscultation. CV Examination: RRR, no murmurs, no S3  or S4. Prophylactic Antibiotics: The patient does not                         require prophylactic antibiotics. Prior                         Anticoagulants: The patient has taken no  anticoagulant                         or antiplatelet agents. ASA Grade Assessment: III - A                         patient with severe systemic disease. After reviewing                         the risks and benefits, the patient was deemed in                         satisfactory condition to undergo the procedure. The                         anesthesia plan was to use monitored anesthesia care                         (MAC). Immediately prior to administration of                         medications, the patient was re-assessed for adequacy                         to receive sedatives. The heart rate, respiratory                         rate, oxygen saturations, blood pressure, adequacy of                         pulmonary ventilation, and response to care were                         monitored throughout the procedure. The physical                         status of the patient was re-assessed after the                         procedure.                        After obtaining informed consent, the endoscope was                         passed under direct vision. Throughout the procedure,                         the patient's blood pressure, pulse, and oxygen                         saturations were monitored continuously. The Endoscope  was introduced through the mouth, and advanced to the                         second part of duodenum. The upper GI endoscopy was                         accomplished without difficulty. The patient tolerated                         the procedure well. Findings:      The duodenal bulb, first portion of the duodenum and second portion of       the duodenum were normal. Estimated blood loss: none.      Two less than 5 mm sessile polyps with no bleeding and no stigmata of       recent bleeding were found in the gastric antrum. Biopsies were taken       with a cold forceps for histology. Estimated blood loss was minimal.      Localized  granular mucosa was found on the greater curvature of the       stomach. Biopsies were taken with a cold forceps for histology.       Estimated blood loss was minimal.      The exam of the stomach was otherwise normal.      Esophagogastric landmarks were identified: the gastroesophageal junction       was found at 40 cm from the incisors.      There were esophageal mucosal changes suspicious for short-segment       Barrett's esophagus present at the gastroesophageal junction. The       maximum longitudinal extent of these mucosal changes was 2 cm in length.       tongues of salmon colored mucosa. biopsies taken in 4 quadrant fashion.       Imaging was performed using white light and narrow band imaging to       visualize the mucosa. Mucosa was biopsied with a cold forceps for       histology. One specimen bottle was sent to pathology. Estimated blood       loss was minimal.      Abnormal motility was noted in the esophagus. The cricopharyngeus was       normal. There is spasticity of the esophageal body. The distal       esophagus/lower esophageal sphincter is open. The scope was withdrawn.       Dilation was performed with a Maloney dilator with no resistance at 52       Fr. The dilation site was examined following endoscope reinsertion and       showed no change. Estimated blood loss: none.      The exam of the esophagus was otherwise normal. Impression:            - Normal duodenal bulb, first portion of the duodenum                         and second portion of the duodenum.                        - Two gastric polyps. Biopsied.                        - Granular gastric mucosa.  Biopsied.                        - Esophagogastric landmarks identified.                        - Esophageal mucosal changes suspicious for                         short-segment Barrett's esophagus. Biopsied.                        - Abnormal esophageal motility, suspicious for                          esophageal spasm. Dilated. Recommendation:        - Patient has a contact number available for                         emergencies. The signs and symptoms of potential                         delayed complications were discussed with the patient.                         Return to normal activities tomorrow. Written                         discharge instructions were provided to the patient.                        - Discharge patient to home.                        - Resume previous diet.                        - Continue present medications.                        - Await pathology results.                        - Repeat upper endoscopy for surveillance based on                         pathology results.                        - Return to GI clinic as previously scheduled.                        - The findings and recommendations were discussed with                         the patient. Procedure Code(s):     --- Professional ---                        [id number], Esophagogastroduodenoscopy, flexible,                         transoral; with biopsy, single or multiple  [id number], Dilation of esophagus, by unguided sound or                         bougie, single or multiple passes Diagnosis Code(s):     --- Professional ---                        K22.89, Other specified disease of esophagus                        K31.7, Polyp of stomach and duodenum                        K31.89, Other diseases of stomach and duodenum                        K22.4, Dyskinesia of esophagus CPT copyright 2022 American Medical Association. All rights reserved. The codes documented in this report are preliminary and upon coder review may  be revised to meet current compliance requirements. Attending Participation:      I personally performed the entire procedure. Elspeth Jungling, DO Elspeth Ozell Jungling DO, DO 09/16/2024 10:02:38 AM This report has been signed electronically. Number of Addenda:  0 Note Initiated On: 09/16/2024 9:45 AM Estimated Blood Loss:  Estimated blood loss was minimal.      [facility]

## 2024-09-17 LAB — SURGICAL PATHOLOGY

## 2024-10-22 NOTE — Progress Notes (Deleted)
 Referring Physician:  Zachary Idelia LABOR, MD [address] [place],  [place] [address]  Primary Physician:  Timothy Idelia LABOR, MD  History of Present Illness: 10/22/2024 [name] is here today with a chief complaint of ***  Radiculopathy, lumbar region  Lower back pain. Any numbness, tingling, or weakness.  Duration: *** Location: *** Quality: *** Severity: ***  Precipitating: aggravated by *** Modifying factors: made better by *** Weakness: none Timing: *** Bowel/Bladder Dysfunction: none  Conservative measures:  Physical therapy: *** Has not participated in? Multimodal medical therapy including regular antiinflammatories: *** methocarbamol Injections: *** epidural steroid injections?  Past Surgery: *** 2015 Lumber Fusion  Timothy Kennedy has ***no symptoms of cervical myelopathy.  The symptoms are causing a significant impact on the patient's life.   Review of Systems:  A 10 point review of systems is negative, except for the pertinent positives and negatives detailed in the HPI.  Past Medical History: Past Medical History:  Diagnosis Date   Abdominal aortic atherosclerosis    Arthritis    Asymmetrical sensorineural hearing loss    Bilateral carotid artery stenosis    BPH associated with nocturia    Cervical radiculopathy    Cervical radiculopathy    Cholecystitis    Coronary artery disease    DDD (degenerative disc disease), cervical    DDD (degenerative disc disease), lumbar    Diabetes mellitus without complication (HCC)    Fatty liver disease, nonalcoholic    GERD (gastroesophageal reflux disease)    Gout    Hyperlipidemia    Hypertension    Left carpal tunnel syndrome    Lumbar radiculitis    Lumbar radiculopathy    Lumbar stenosis with neurogenic claudication    Lymphedema    Right carpal tunnel syndrome    Seronegative rheumatoid arthritis (HCC)    Sleep apnea    Stasis dermatitis of both legs    Symptomatic anemia    Trigger finger,  left little finger     Past Surgical History: Past Surgical History:  Procedure Laterality Date   CARPAL TUNNEL RELEASE Bilateral 07/2016   surgey on both hands   CHOLECYSTECTOMY     COLONOSCOPY N/A 09/16/2024   Procedure: COLONOSCOPY;  Surgeon: Timothy Elspeth Sharper, DO;  Location: [facility] ENDOSCOPY;  Service: Gastroenterology;  Laterality: N/A;  MOUNJARO   COLONOSCOPY WITH PROPOFOL  N/A 04/10/2023   Procedure: COLONOSCOPY WITH PROPOFOL ;  Surgeon: Timothy Elspeth Sharper, DO;  Location: [facility] ENDOSCOPY;  Service: Endoscopy;  Laterality: N/A;   ESOPHAGOGASTRODUODENOSCOPY N/A 09/16/2024   Procedure: EGD (ESOPHAGOGASTRODUODENOSCOPY);  Surgeon: Timothy Elspeth Sharper, DO;  Location: [facility] ENDOSCOPY;  Service: Gastroenterology;  Laterality: N/A;   HERNIA REPAIR     MALONEY DILATION  09/16/2024   Procedure: DILATION, ESOPHAGUS, USING MALONEY DILATOR;  Surgeon: Timothy Elspeth Sharper, DO;  Location: [facility] ENDOSCOPY;  Service: Gastroenterology;;   NECK SURGERY  01/1998 08/2019   NEUROPLASTY / TRANSPOSITION MEDIAN NERVE AT CARPAL TUNNEL Left    POLYPECTOMY  09/16/2024   Procedure: POLYPECTOMY, INTESTINE;  Surgeon: Timothy Elspeth Sharper, DO;  Location: [facility] ENDOSCOPY;  Service: Gastroenterology;;   [facility] SURGERY  01/1998   fusion of C5 and C6   SPINE SURGERY  08/2014   fusion    UMBILICAL HERNIA REPAIR  01/2003    Allergies: Allergies as of 10/26/2024 - Review Complete 09/16/2024  Allergen Reaction Noted   Atorvastatin Other (See Comments) 11/14/2017   Metformin Diarrhea 03/30/2014    Medications: Outpatient Encounter Medications as of 10/26/2024  Medication Sig  amLODipine (NORVASC) 5 MG tablet Take by mouth daily.   aspirin 81 MG EC tablet Take by mouth daily.   Blood Glucose Monitoring Suppl (FIFTY50 GLUCOSE METER 2.0) w/Device KIT Use as directed E11.9   carvedilol (COREG) 25 MG tablet Take by mouth 2 (two) times daily.   DULoxetine (CYMBALTA) 30 MG capsule Take by mouth daily.   ezetimibe  (ZETIA) 10 MG tablet Take 10 mg by mouth.   fluticasone (FLONASE) 50 MCG/ACT nasal spray Place into the nose as needed.   glipiZIDE (GLUCOTROL XL) 10 MG 24 hr tablet Take by mouth 2 (two) times daily.   hydrALAZINE (APRESOLINE) 50 MG tablet Take 50 mg by mouth 2 (two) times daily.   hydrochlorothiazide (HYDRODIURIL) 25 MG tablet Take 25 mg by mouth daily.   lisinopril (ZESTRIL) 20 MG tablet Take by mouth in the morning and at bedtime.   methocarbamol (ROBAXIN) 750 MG tablet Take 750 mg by mouth 3 (three) times daily.   ONETOUCH VERIO test strip daily.   pantoprazole (PROTONIX) 40 MG tablet Take by mouth daily.   pravastatin (PRAVACHOL) 20 MG tablet Take by mouth daily.   SUMAtriptan (IMITREX) 25 MG tablet Take by mouth as needed.   No facility-administered encounter medications on file as of 10/26/2024.    Social History: Social History   Tobacco Use   Smoking status: Former    Types: Cigarettes   Smokeless tobacco: Former    Types: Associate Professor status: Never Used  Substance Use Topics   Alcohol use: Yes   Drug use: Never    Family Medical History: Family History  Problem Relation Age of Onset   Hypertension Mother    Diabetes Mother    Hypertension Father     Physical Examination: @VITALWITHPAIN @  General: Patient is well developed, well nourished, calm, collected, and in no apparent distress. Attention to examination is appropriate.  Psychiatric: Patient is non-anxious.  Head:  Pupils equal, round, and reactive to light.  ENT:  Oral mucosa appears well hydrated.  Neck:   Supple.  ***Full range of motion.  Respiratory: Patient is breathing without any difficulty.  Extremities: No edema.  Vascular: Palpable dorsal pedal pulses.  Skin:   On exposed skin, there are no abnormal skin lesions.  NEUROLOGICAL:     Awake, alert, oriented to person, place, and time.  Speech is clear and fluent. Fund of knowledge is appropriate.   Cranial Nerves:  Pupils equal round and reactive to light.  Facial tone is symmetric.  Facial sensation is symmetric.  ROM of spine: ***full.  Palpation of spine: ***non tender.    Strength: Side Biceps Triceps Deltoid Interossei Grip Wrist Ext. Wrist Flex.  R 5 5 5 5 5 5 5   L 5 5 5 5 5 5 5    Side Iliopsoas Quads Hamstring PF DF EHL  R 5 5 5 5 5 5   L 5 5 5 5 5 5    Reflexes are ***2+ and symmetric at the biceps, triceps, brachioradialis, patella and achilles.   Hoffman's is absent.  Clonus is not present.  Toes are down-going.  Bilateral upper and lower extremity sensation is intact to light touch.    Gait is normal.   No difficulty with tandem gait.   No evidence of dysmetria noted.  Medical Decision Making  Imaging: ***  I have personally reviewed the images and agree with the above interpretation.  Assessment and Plan: Timothy Kennedy is a pleasant 63 y.o. male with ***  Thank you for involving me in the care of this patient.   I spent a total of *** minutes in both face-to-face and non-face-to-face activities for this visit on the date of this encounter.   Lyle Decamp, PA-C Dept. of Neurosurgery

## 2024-10-25 ENCOUNTER — Inpatient Hospital Stay
Admission: RE | Admit: 2024-10-25 | Discharge: 2024-10-25 | Disposition: A | Payer: Self-pay | Source: Ambulatory Visit | Attending: Physician Assistant | Admitting: Physician Assistant

## 2024-10-25 ENCOUNTER — Other Ambulatory Visit: Payer: Self-pay

## 2024-10-25 DIAGNOSIS — Z049 Encounter for examination and observation for unspecified reason: Secondary | ICD-10-CM

## 2024-10-26 ENCOUNTER — Ambulatory Visit: Admitting: Physician Assistant
